# Patient Record
Sex: Female | Born: 1995 | Hispanic: No | Marital: Married | Smoking: Never smoker
Health system: Southern US, Community
[De-identification: ages and names within clinical notes are randomized; demographics above are authoritative.]

## PROBLEM LIST (undated history)

## (undated) DIAGNOSIS — F32A Depression, unspecified: Secondary | ICD-10-CM

## (undated) DIAGNOSIS — E282 Polycystic ovarian syndrome: Secondary | ICD-10-CM

## (undated) DIAGNOSIS — J45909 Unspecified asthma, uncomplicated: Secondary | ICD-10-CM

## (undated) DIAGNOSIS — D509 Iron deficiency anemia, unspecified: Secondary | ICD-10-CM

## (undated) DIAGNOSIS — F329 Major depressive disorder, single episode, unspecified: Secondary | ICD-10-CM

## (undated) DIAGNOSIS — O99019 Anemia complicating pregnancy, unspecified trimester: Secondary | ICD-10-CM

## (undated) HISTORY — DX: Polycystic ovarian syndrome: E28.2

---
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
## 2013-12-04 ENCOUNTER — Emergency Department: Payer: Self-pay | Admitting: Emergency Medicine

## 2013-12-04 LAB — URINALYSIS, COMPLETE
Bacteria: NONE SEEN
Bilirubin,UR: NEGATIVE
Blood: NEGATIVE
Glucose,UR: NEGATIVE mg/dL (ref 0–75)
Nitrite: NEGATIVE
Ph: 5 (ref 4.5–8.0)
Protein: 100
RBC,UR: 6 /HPF (ref 0–5)
Specific Gravity: 1.029 (ref 1.003–1.030)
Squamous Epithelial: 144
WBC UR: 14 /HPF (ref 0–5)

## 2013-12-04 LAB — CBC
HCT: 42.4 % (ref 35.0–47.0)
HGB: 14.5 g/dL (ref 12.0–16.0)
MCH: 29.9 pg (ref 26.0–34.0)
MCHC: 34.2 g/dL (ref 32.0–36.0)
MCV: 88 fL (ref 80–100)
Platelet: 278 10*3/uL (ref 150–440)
RBC: 4.84 10*6/uL (ref 3.80–5.20)
RDW: 12.5 % (ref 11.5–14.5)
WBC: 14.4 10*3/uL — ABNORMAL HIGH (ref 3.6–11.0)

## 2013-12-04 LAB — HCG, QUANTITATIVE, PREGNANCY: Beta Hcg, Quant.: 60236 m[IU]/mL — ABNORMAL HIGH

## 2013-12-13 DIAGNOSIS — D509 Iron deficiency anemia, unspecified: Secondary | ICD-10-CM

## 2013-12-13 HISTORY — DX: Iron deficiency anemia, unspecified: D50.9

## 2014-04-29 ENCOUNTER — Observation Stay: Payer: Self-pay

## 2014-07-20 ENCOUNTER — Inpatient Hospital Stay: Payer: Self-pay

## 2014-07-20 LAB — CBC WITH DIFFERENTIAL/PLATELET
Basophil #: 0.1 10*3/uL (ref 0.0–0.1)
Basophil %: 0.3 %
Eosinophil #: 0 10*3/uL (ref 0.0–0.7)
Eosinophil %: 0.1 %
HCT: 33.5 % — ABNORMAL LOW (ref 35.0–47.0)
HGB: 10.3 g/dL — ABNORMAL LOW (ref 12.0–16.0)
Lymphocyte #: 1.1 10*3/uL (ref 1.0–3.6)
Lymphocyte %: 5.9 %
MCH: 23.4 pg — ABNORMAL LOW (ref 26.0–34.0)
MCHC: 30.8 g/dL — ABNORMAL LOW (ref 32.0–36.0)
MCV: 76 fL — ABNORMAL LOW (ref 80–100)
Monocyte #: 1.2 x10 3/mm — ABNORMAL HIGH (ref 0.2–0.9)
Monocyte %: 6.4 %
Neutrophil #: 16.5 10*3/uL — ABNORMAL HIGH (ref 1.4–6.5)
Neutrophil %: 87.3 %
Platelet: 309 10*3/uL (ref 150–440)
RBC: 4.4 10*6/uL (ref 3.80–5.20)
RDW: 17.4 % — ABNORMAL HIGH (ref 11.5–14.5)
WBC: 18.9 10*3/uL — ABNORMAL HIGH (ref 3.6–11.0)

## 2014-07-22 LAB — GC/CHLAMYDIA PROBE AMP

## 2014-07-22 LAB — HEMATOCRIT: HCT: 23.6 % — ABNORMAL LOW (ref 35.0–47.0)

## 2015-04-05 NOTE — Op Note (Signed)
PATIENT NAME:  Cindy Aguilar, Prabhleen M MR#:  [id number] DATE OF BIRTH:  1996-07-06  DATE OF PROCEDURE:  07/21/2014.  PREOPERATIVE DIAGNOSES:  1.  Intrauterine pregnancy at 40 weeks, 4 days gestational age.  2.  Secondary arrest of dilatation.   POSTOPERATIVE DIAGNOSES: 1.  Intrauterine pregnancy at 40 weeks, 4 days gestational age.  2.  Secondary arrest of dilatation.   PROCEDURE: Primary low transverse cesarean section via Pfannenstiel incision.   ANESTHESIA: Spinal.   SURGEON: Thomasene MohairStephen Lashara Urey, MD.   ASSISTANT: Deatra RobinsonKaren Jones, CNM.   ESTIMATED BLOOD LOSS: 500 mL.   OPERATIVE FLUIDS: 1000 mL crystalloid,   COMPLICATIONS: None.   FINDINGS:  Viable female infant with Apgars of 9 at 1 minute and 9 at 5 minutes with thick meconium fluid.  SPECIMENS: None.   CONDITION AT THE END OF PROCEDURE: Stable.   PROCEDURE IN DETAIL: The patient was taken to the operating room where spinal anesthesia was administered and found to be adequate. The patient was placed in the dorsal supine position with a leftward tilt and prepped and draped in the usual sterile fashion. After a timeout was called a Pfannenstiel incision was made and carried through the various layers until the peritoneum was identified and entered sharply. A bladder flap was created and the bladder retractor placed to pull the bladder out of the operative area of interest. A low transverse hysterotomy was made with a scalpel and extended laterally with cranial and caudal tension. The fetal vertex was elevated to the hysterotomy and delivered followed by the shoulders and the rest of the body with fundal pressure without difficulty. The cord was clamped and cut and the infant was handed to the pediatricians. The placenta delivered spontaneously, intact with a 3-vessel cord. The uterus was cleared of all clots and debris.   The hysterotomy was closed using 0-Vicryl in a running locked fashion. A 2nd layer of the same suture was used in an  imbricating fashion to obtain hemostasis. The uterus was returned to the abdomen and the gutters were cleared of all clots and debris. The rectus muscle bellies were reapproximated using a horizontal mattress suture.   The On-Q catheters were placed according to the manufacturer's recommendations. They were placed approximately 4 cm cephalad to the incision line, approximately 1 cm apart, straddling the midline. They were inserted to a depth of approximately the one-third marking on the catheters. They were positioned just superficial to the rectus abdominis muscles and just deep to the rectus fascia. The fascia was closed using 1-0 PDS looped in a running fashion. The skin edges were reapproximated using 3-0 Vicryl interrupted. Skin was closed using a subcuticular stapler.   The On-Q catheters were affixed to the skin using Dermabond as well as Steri-Strips and Tegaderm. Each catheter was bolused with 5 mL of 5% Marcaine plain for a total of 10 mL.   The patient tolerated the procedure well. Sponge, lap, and needle counts were correct x 2. The patient received 2 grams of Ancef prior to skin incision for antibiotic prophylaxis. For VTE prophylaxis she was wearing pneumatic compression stockings which were on and operating throughout the entire procedure. The patient was taken to the recovery area in stable condition.    ____________________________ Conard NovakStephen D. Solana Coggin, MD sdj:lt D: 07/21/2014 06:36:09 ET T: 07/21/2014 07:33:08 ET JOB#: [id number]  cc: Conard NovakStephen D. Deysha Cartier, MD, <Dictator> Conard NovakSTEPHEN D Marajade Lei MD ELECTRONICALLY SIGNED 08/28/2014 18:08

## 2015-04-22 NOTE — H&P (Signed)
L&D Evaluation:  History Expanded:  HPI 19 yo G1, EDD of 07/17/14 per 8 wk US, presents at 1133w6d with c/o no fetal movement and bright red vaginal bleeding this am. Denies contractions or pain, no LOF. Reports recent yellow/green vaginal discharge. [facility], entry to care at 13 weeks. Negative Tetra. Otherwise unremarkable. Anatomy US showed anterior placenta.   Blood Type (Maternal) B positive   Group B Strep Results Maternal (Result >5wks must be treated as unknown) unknown/result > 5 weeks ago   Maternal HIV Negative   Maternal Syphilis Ab Nonreactive   Maternal Varicella Immune   Rubella Results (Maternal) immune   Presents with decreased fetal movement, vaginal bleeding   Patient's Medical History No Chronic Illness   Patient's Surgical History none   Medications Pre Natal Vitamins   Allergies NKDA   Social History none   Exam:  Vital Signs stable   General no apparent distress   Mental Status clear   Abdomen gravid, non-tender   Pelvic no external lesions, cervix closed and thick, dark red blood clot noted in vaginal vault, wet mount negative   FHT normal rate with no decels   Ucx absent   Impression:  Impression IUP at 6633w6d with vaginal bleeding   Plan:  Comments US ordered to assess placenta location and for abruption   Electronic Signatures: Ishmel Acevedo, Marta Lamasamara K (CNM)  (Signed 18-May-15 13:30)  Authored: L&D Evaluation   Last Updated: 18-May-15 13:30 by Vella KohlerBrothers, Barbarann Kelly K (CNM)

## 2015-04-22 NOTE — H&P (Signed)
L&D Evaluation:  History Expanded:  HPI 19 yo G1 at 8337w3d gestational age by LMP consistent with 1st trimester ultrasound.  Pregnancy has been uncomplicated.  Presents with regular uterine contractions since about 6am today, getting stronger.  Her cervical exam in the office this week was 1cm.  She notes positive fetal movement, no leakage of fluid. She has had some blood mixed with her discharge today.   Gravida 1   Blood Type (Maternal) B positive   Group B Strep Results Maternal (Result >5wks must be treated as unknown) negative  06/19/14   Maternal HIV Negative   Maternal Syphilis Ab Nonreactive   Maternal Varicella Immune   Rubella Results (Maternal) immune   [facility]   Patient's Medical History No Chronic Illness   Patient's Surgical History none   Medications Pre Natal Vitamins   Allergies NKDA   Social History none   Family History Non-Contributory   ROS:  ROS All systems were reviewed.  HEENT, CNS, GI, GU, Respiratory, CV, Renal and Musculoskeletal systems were found to be normal., unless noted in HPI   Exam:  Vital Signs T98.4, BP 131/72, P81, R16   Urine Protein not completed   General no apparent distress   Mental Status clear   Chest clear   Heart normal sinus rhythm   Abdomen gravid, non-tender   Estimated Fetal Weight 6.75 pounds   Fetal Position ceph   Back no CVAT   Edema no edema   Pelvic no external lesions, 4cm per RN (Change from 3cm at presentation)   Mebranes Intact   FHT normal rate with no decels, 135/mod var/+accels/no decels   Ucx regular, 2-3 q 10 min   Skin no lesions   Impression:  Impression 1) Intrauterine pregnancy at 6037w3d gestational age, 2) active labor   Plan:  Comments 1) Labor: expectant management  2) Fetus - category I tracing  3) PNL B postive / ABSC negative / RI / VZI / HIV neg / RPR NR / HBsAg neg / 2nd trimester screen negative / 1-hr OGTT 89 / GBS negative on 06/19/14      4) TDAP  given 05/28/14  5) Disposition - home postpartum   Electronic Signatures: Conard NovakJackson, Rynlee Lisbon D (MD)  (Signed 08-Aug-15 14:59)  Authored: L&D Evaluation   Last Updated: 08-Aug-15 14:59 by Conard NovakJackson, Vicenta Olds D (MD)

## 2015-10-31 ENCOUNTER — Encounter (HOSPITAL_COMMUNITY): Payer: Self-pay | Admitting: Nurse Practitioner

## 2015-10-31 ENCOUNTER — Emergency Department (HOSPITAL_COMMUNITY)
Admission: EM | Admit: 2015-10-31 | Discharge: 2015-10-31 | Disposition: A | Discharge status: Home / Self Care | Payer: BLUE CROSS/BLUE SHIELD | Attending: Emergency Medicine | Admitting: Emergency Medicine

## 2015-10-31 DIAGNOSIS — R809 Proteinuria, unspecified: Secondary | ICD-10-CM | POA: Insufficient documentation

## 2015-10-31 DIAGNOSIS — Z3202 Encounter for pregnancy test, result negative: Secondary | ICD-10-CM | POA: Insufficient documentation

## 2015-10-31 DIAGNOSIS — R55 Syncope and collapse: Secondary | ICD-10-CM | POA: Insufficient documentation

## 2015-10-31 DIAGNOSIS — R231 Pallor: Secondary | ICD-10-CM | POA: Insufficient documentation

## 2015-10-31 HISTORY — DX: Anemia complicating pregnancy, unspecified trimester: O99.019

## 2015-10-31 HISTORY — DX: Iron deficiency anemia, unspecified: D50.9

## 2015-10-31 LAB — URINALYSIS, ROUTINE W REFLEX MICROSCOPIC
Bilirubin Urine: NEGATIVE
Glucose, UA: NEGATIVE mg/dL
Hgb urine dipstick: NEGATIVE
Ketones, ur: 15 mg/dL — AB
Nitrite: NEGATIVE
Protein, ur: 100 mg/dL — AB
Specific Gravity, Urine: 1.037 — ABNORMAL HIGH (ref 1.005–1.030)
pH: 6 (ref 5.0–8.0)

## 2015-10-31 LAB — CBC
HCT: 37.4 % (ref 36.0–46.0)
Hemoglobin: 13 g/dL (ref 12.0–15.0)
MCH: 30.3 pg (ref 26.0–34.0)
MCHC: 34.8 g/dL (ref 30.0–36.0)
MCV: 87.2 fL (ref 78.0–100.0)
Platelets: 257 10*3/uL (ref 150–400)
RBC: 4.29 MIL/uL (ref 3.87–5.11)
RDW: 12.3 % (ref 11.5–15.5)
WBC: 10.9 10*3/uL — ABNORMAL HIGH (ref 4.0–10.5)

## 2015-10-31 LAB — BASIC METABOLIC PANEL
Anion gap: 7 (ref 5–15)
BUN: 11 mg/dL (ref 6–20)
CO2: 26 mmol/L (ref 22–32)
Calcium: 9.3 mg/dL (ref 8.9–10.3)
Chloride: 105 mmol/L (ref 101–111)
Creatinine, Ser: 0.65 mg/dL (ref 0.44–1.00)
GFR calc Af Amer: >60 mL/min (ref >60)
GFR calc non Af Amer: >60 mL/min (ref >60)
Glucose, Bld: 108 mg/dL — ABNORMAL HIGH (ref 65–99)
Potassium: 3.8 mmol/L (ref 3.5–5.1)
Sodium: 138 mmol/L (ref 135–145)

## 2015-10-31 LAB — URINE MICROSCOPIC-ADD ON

## 2015-10-31 LAB — CBG MONITORING, ED: Glucose-Capillary: 62 mg/dL — ABNORMAL LOW (ref 65–99)

## 2015-10-31 LAB — HCG, QUANTITATIVE, PREGNANCY: hCG, Beta Chain, Quant, S: <1 m[IU]/mL (ref <5)

## 2015-10-31 LAB — POC URINE PREG, ED: Preg Test, Ur: NEGATIVE

## 2015-10-31 MED ORDER — SODIUM CHLORIDE 0.9 % IV BOLUS (SEPSIS)
1000.0000 mL | Freq: Once | INTRAVENOUS | Status: AC
  Administered 2015-10-31: 1000 mL via INTRAVENOUS

## 2015-10-31 NOTE — ED Notes (Signed)
Bed: WA06 Expected date:  Expected time:  Means of arrival:  Comments: EMS- 19yo F, syncope

## 2015-10-31 NOTE — Progress Notes (Signed)
Patient listed as having [employer] insurance without a pcp.  EDCM spoke to patient at bedside.  Patient reports her pcp is located in [place] at [facility] Chelsea Holland NP.  System updated.

## 2015-10-31 NOTE — ED Notes (Signed)
Nurse currently trying to pull blood from IV

## 2015-10-31 NOTE — ED Notes (Signed)
[facility]. Was shopping at [employer] [facility] [employer] when she began to feel nauseous and week. She complains of seeing spots and then losing consciousness. Her husband assisted her to the ground and she denies falling or injury. She was unconscious for approximately 15-20 seconds per family members. Pt began exhibiting cold like symptoms less than 24 hours ago and is taking dayquil for symptomatic relief and increasing fluids. She denies ever having lost consciousness before, no history of cardiac illness or vertigo.

## 2015-10-31 NOTE — Discharge Instructions (Signed)
Your workup today was unremarkable. As we discussed, please follow-up with your primary care doctor within one week. Please have them re-check your urine to make sure the protein in your urine is resolving. Drink plenty of fluids. Return to the ER for new or worsening symptoms.  Syncope Syncope is a medical term for fainting or passing out. This means you lose consciousness and drop to the ground. People are generally unconscious for less than 5 minutes. You may have some muscle twitches for up to 15 seconds before waking up and returning to normal. Syncope occurs more often in older adults, but it can happen to anyone. While most causes of syncope are not dangerous, syncope can be a sign of a serious medical problem. It is important to seek medical care.  CAUSES  Syncope is caused by a sudden drop in blood flow to the brain. The specific cause is often not determined. Factors that can bring on syncope include:  Taking medicines that lower blood pressure.  Sudden changes in posture, such as standing up quickly.  Taking more medicine than prescribed.  Standing in one place for too long.  Seizure disorders.  Dehydration and excessive exposure to heat.  Low blood sugar (hypoglycemia).  Straining to have a bowel movement.  Heart disease, irregular heartbeat, or other circulatory problems.  Fear, emotional distress, seeing blood, or severe pain. SYMPTOMS  Right before fainting, you may:  Feel dizzy or light-headed.  Feel nauseous.  See all white or all black in your field of vision.  Have cold, clammy skin. DIAGNOSIS  Your health care provider will ask about your symptoms, perform a physical exam, and perform an electrocardiogram (ECG) to record the electrical activity of your heart. Your health care provider may also perform other heart or blood tests to determine the cause of your syncope which may include:  Transthoracic echocardiogram (TTE). During echocardiography, sound waves  are used to evaluate how blood flows through your heart.  Transesophageal echocardiogram (TEE).  Cardiac monitoring. This allows your health care provider to monitor your heart rate and rhythm in real time.  Holter monitor. This is a portable device that records your heartbeat and can help diagnose heart arrhythmias. It allows your health care provider to track your heart activity for several days, if needed.  Stress tests by exercise or by giving medicine that makes the heart beat faster. TREATMENT  In most cases, no treatment is needed. Depending on the cause of your syncope, your health care provider may recommend changing or stopping some of your medicines. HOME CARE INSTRUCTIONS  Have someone stay with you until you feel stable.  Do not drive, use machinery, or play sports until your health care provider says it is okay.  Keep all follow-up appointments as directed by your health care provider.  Lie down right away if you start feeling like you might faint. Breathe deeply and steadily. Wait until all the symptoms have passed.  Drink enough fluids to keep your urine clear or pale yellow.  If you are taking blood pressure or heart medicine, get up slowly and take several minutes to sit and then stand. This can reduce dizziness. SEEK IMMEDIATE MEDICAL CARE IF:   You have a severe headache.  You have unusual pain in the chest, abdomen, or back.  You are bleeding from your mouth or rectum, or you have black or tarry stool.  You have an irregular or very fast heartbeat.  You have pain with breathing.  You have repeated  fainting or seizure-like jerking during an episode.  You faint when sitting or lying down.  You have confusion.  You have trouble walking.  You have severe weakness.  You have vision problems. If you fainted, call your local emergency services (911 in [place] Do not drive yourself to the hospital.    This information is not intended to replace advice  given to you by your health care provider. Make sure you discuss any questions you have with your health care provider.   Document Released: 11/29/2005 Document Revised: 04/15/2015 Document Reviewed: 01/28/2012 Elsevier Interactive Patient Education [employer] Elsevier Inc.   Please obtain all of your results from medical records or have your doctors office obtain the results - share them with your doctor - you should be seen at your doctors office in the next 2 days. Call today to arrange your follow up. Take the medications as prescribed. Please review all of the medicines and only take them if you do not have an allergy to them. Please be aware that if you are taking birth control pills, taking other prescriptions, ESPECIALLY ANTIBIOTICS may make the birth control ineffective - if this is the case, either do not engage in sexual activity or use alternative methods of birth control such as condoms until you have finished the medicine and your family doctor says it is OK to restart them. If you are on a blood thinner such as COUMADIN, be aware that any other medicine that you take may cause the coumadin to either work too much, or not enough - you should have your coumadin level rechecked in next 7 days if this is the case.  ?  It is also a possibility that you have an allergic reaction to any of the medicines that you have been prescribed - Everybody reacts differently to medications and while MOST people have no trouble with most medicines, you may have a reaction such as nausea, vomiting, rash, swelling, shortness of breath. If this is the case, please stop taking the medicine immediately and contact your physician.  ?  You should return to the ER if you develop severe or worsening symptoms.

## 2015-11-03 NOTE — ED Provider Notes (Signed)
CSN: [id number]     Arrival date & time 10/31/15  1638 History   First MD Initiated Contact with Patient 10/31/15 1732     Chief Complaint  Patient presents with  . Loss of Consciousness    HPI   Cindy Aguilar is an 19 y.o. female with no significant PMH who presents to the ED for evaluation following a syncopal episode. States she was standing in line at the grocery store when suddenly she felt clammy and weak. Her husband reports seeing her fall back and catching her before she hit the ground. States that pt was unconscious for ~30sec and then regained consciousness. She does not remember experiencing any prodromal symptoms. Pt reports she now feels a little tired but otherwise back to baseline. She states she has never fainted before. Denies history of seizures. She does report she has had a cold with nasal congestion for the past day or so and has not been eating much. She denies any chest pain, palpitations, SOB, fever, chills, N/V/D. Denies fam hx of sudden cardiac arrest at a young age. Denies feeling dizzy or weak. Denies headache, visual changes, tinnitus. She did not bite her tongue.  Past Medical History  Diagnosis Date  . Iron deficiency anemia of pregnancy 2015   Past Surgical History  Procedure Laterality Date  . Cesarean section  2015   History reviewed. No pertinent family history. Social History  Substance Use Topics  . Smoking status: Never Smoker   . Smokeless tobacco: None  . Alcohol Use: No   OB History    No data available     Review of Systems  All other systems reviewed and are negative.     Allergies  Review of patient's allergies indicates no known allergies.  Home Medications   Prior to Admission medications   Medication Sig Start Date End Date Taking? Authorizing Provider  levonorgestrel (MIRENA) 20 MCG/24HR IUD 1 each by Intrauterine route once.   Yes Historical Provider, MD  Pseudoephedrine-APAP-DM (DAYQUIL PO) Take 1 tablet by mouth once.   Yes  Historical Provider, MD   BP 109/67 mmHg  Pulse 93  Temp(Src) 98.1 F (36.7 C) (Oral)  Resp 18  Ht  (1.626 m)  Wt 52.164 kg  BMI 19.73 kg/m2  SpO2 100%  LMP 10/13/2013 (Approximate) Physical Exam  Constitutional: She is oriented to person, place, and time.  Pt appears tired, slightly pale, but otherwise NAD  HENT:  Right Ear: External ear normal.  Left Ear: External ear normal.  Nose: Nose normal.  Mouth/Throat: Oropharynx is clear and moist. No oropharyngeal exudate.  Eyes: Conjunctivae and EOM are normal. Pupils are equal, round, and reactive to light.  Neck: Normal range of motion. Neck supple.  Cardiovascular: Normal rate, regular rhythm, normal heart sounds and intact distal pulses.   No murmur heard. Pulmonary/Chest: Effort normal and breath sounds normal. No respiratory distress. She has no wheezes. She exhibits no tenderness.  Abdominal: Soft. Bowel sounds are normal. She exhibits no distension. There is no tenderness. There is no rebound and no guarding.  Musculoskeletal: Normal range of motion. She exhibits no edema or tenderness.  Neurological: She is alert and oriented to person, place, and time. She has normal strength. No cranial nerve deficit or sensory deficit. She displays a negative Romberg sign. Coordination and gait normal.  Skin: Skin is warm and dry. There is pallor.  Psychiatric: She has a normal mood and affect.  Nursing note and vitals reviewed.   ED Course  Procedures (including critical care time) Labs Review Labs Reviewed  BASIC METABOLIC PANEL - Abnormal; Notable for the following:    Glucose, Bld 108 (*)    All other components within normal limits  CBC - Abnormal; Notable for the following:    WBC 10.9 (*)    All other components within normal limits  URINALYSIS, ROUTINE W REFLEX MICROSCOPIC (NOT AT [facility]) - Abnormal; Notable for the following:    APPearance CLOUDY (*)    Specific Gravity, Urine 1.037 (*)    Ketones, ur 15 (*)     Protein, ur 100 (*)    Leukocytes, UA TRACE (*)    All other components within normal limits  URINE MICROSCOPIC-ADD ON - Abnormal; Notable for the following:    Squamous Epithelial / LPF 6-30 (*)    Bacteria, UA FEW (*)    Casts HYALINE CASTS (*)    All other components within normal limits  CBG MONITORING, ED - Abnormal; Notable for the following:    Glucose-Capillary 62 (*)    All other components within normal limits  HCG, QUANTITATIVE, PREGNANCY  CBG MONITORING, ED  POC URINE PREG, ED    Imaging Review No results found. I have personally reviewed and evaluated these images and lab results as part of my medical decision-making.   EKG Interpretation   Date/Time:  Friday October 31 2015 17:42:06 EST Ventricular Rate:  79 PR Interval:  141 QRS Duration: 89 QT Interval:  396 QTC Calculation: 454 R Axis:   67 Text Interpretation:  Sinus rhythm RSR' in V1 or V2, probably normal  variant No previous ECGs available Confirmed by YAO  MD, DAVID ([id number]) on  10/31/2015 5:49:35 PM         MDM   Final diagnoses:  Syncope, unspecified syncope type  Proteinuria   Initial workup unrevealing. EKG NSR. Pt does have some protein in her urine, likely 2/2 dehydration from poor PO intake recently. Will give 1L bolus. Pt's urine shows some trace leuks and bacteria but pt is asymptomatic so will send for culture and defer abx tx at this time. Orthostatic vitals negative. Likely neurocardiogenic syncope vs orthostasis 2/2 hypovolemia and poor PO intake. STrict return precautions given. Pt stable for discharge at this time. Pt instructed to f/u with PCP regarding UA. She verbalizes her agreement.    Carlene CoriaSerena Y Amilcar Reever, PA-C 11/03/15 1125  Richardean Canalavid H Yao, MD 11/04/15 [phone]

## 2017-12-16 ENCOUNTER — Ambulatory Visit: Payer: Self-pay | Admitting: Obstetrics and Gynecology

## 2018-01-20 ENCOUNTER — Other Ambulatory Visit: Payer: Self-pay | Admitting: Nurse Practitioner

## 2018-01-20 DIAGNOSIS — N939 Abnormal uterine and vaginal bleeding, unspecified: Secondary | ICD-10-CM

## 2018-01-24 ENCOUNTER — Ambulatory Visit: Payer: BLUE CROSS/BLUE SHIELD

## 2018-02-03 ENCOUNTER — Ambulatory Visit
Admission: RE | Admit: 2018-02-03 | Discharge: 2018-02-03 | Disposition: A | Discharge status: Home / Self Care | Payer: BLUE CROSS/BLUE SHIELD | Source: Ambulatory Visit | Attending: Nurse Practitioner | Admitting: Nurse Practitioner

## 2018-02-03 DIAGNOSIS — N939 Abnormal uterine and vaginal bleeding, unspecified: Secondary | ICD-10-CM

## 2018-02-03 DIAGNOSIS — N838 Other noninflammatory disorders of ovary, fallopian tube and broad ligament: Secondary | ICD-10-CM | POA: Insufficient documentation

## 2018-02-03 DIAGNOSIS — Z975 Presence of (intrauterine) contraceptive device: Secondary | ICD-10-CM | POA: Insufficient documentation

## 2018-03-03 ENCOUNTER — Ambulatory Visit (INDEPENDENT_AMBULATORY_CARE_PROVIDER_SITE_OTHER): Payer: BLUE CROSS/BLUE SHIELD | Admitting: Advanced Practice Midwife

## 2018-03-03 ENCOUNTER — Encounter: Payer: Self-pay | Admitting: Advanced Practice Midwife

## 2018-03-03 VITALS — BP 110/80 | HR 78 | Ht 63.0 in | Wt 122.0 lb

## 2018-03-03 DIAGNOSIS — Z30432 Encounter for removal of intrauterine contraceptive device: Secondary | ICD-10-CM | POA: Diagnosis not present

## 2018-03-03 NOTE — Progress Notes (Addendum)
    GYNECOLOGY OFFICE PROCEDURE NOTE  [name] is a 22 y.o. G1P1001 here for IUD removal. The patient currently has a Mirena IUD placed 3 years ago. She is requesting removal of the IUD. She and her husband are planning to conceive.  IUD Removal  Patient identified, informed consent performed, consent signed. Time out was performed. Speculum placed in the vagina. The strings of the IUD were grasped and pulled using ring forceps. The IUD was successfully removed in its entirety. Patient tolerated procedure well.   Patient had recent episode of uterine bleeding. [facility] with normal uterus and right ovary finding. Her left ovary was mildly enlarged at 14 cubic cm. Her IUD was in the correct location. She plans to follow up with [facility] regarding the findings of her left ovary.   Tresea MallJane Kaitrin Seybold, CNM [facility], [facility]  IUD insertion CPT [phone],  [facility] [id number] Mirena [id number] Penni BombardLiletta [phone] Paraguard J7300 Rutha BouchardKyleena [id number] IUD remval [id number] Modifer 25, plus Modifer 79 is done during a global billing visit

## 2018-08-04 ENCOUNTER — Ambulatory Visit (INDEPENDENT_AMBULATORY_CARE_PROVIDER_SITE_OTHER): Payer: BLUE CROSS/BLUE SHIELD | Admitting: Advanced Practice Midwife

## 2018-08-04 ENCOUNTER — Other Ambulatory Visit (HOSPITAL_COMMUNITY)
Admission: RE | Admit: 2018-08-04 | Discharge: 2018-08-04 | Disposition: A | Discharge status: Home / Self Care | Payer: BLUE CROSS/BLUE SHIELD | Source: Ambulatory Visit | Attending: Advanced Practice Midwife | Admitting: Advanced Practice Midwife

## 2018-08-04 ENCOUNTER — Encounter: Payer: Self-pay | Admitting: Advanced Practice Midwife

## 2018-08-04 VITALS — BP 100/60 | Wt 126.0 lb

## 2018-08-04 DIAGNOSIS — Z98891 History of uterine scar from previous surgery: Secondary | ICD-10-CM

## 2018-08-04 DIAGNOSIS — O0990 Supervision of high risk pregnancy, unspecified, unspecified trimester: Secondary | ICD-10-CM | POA: Insufficient documentation

## 2018-08-04 DIAGNOSIS — N912 Amenorrhea, unspecified: Secondary | ICD-10-CM

## 2018-08-04 DIAGNOSIS — Z3201 Encounter for pregnancy test, result positive: Secondary | ICD-10-CM

## 2018-08-04 DIAGNOSIS — Z113 Encounter for screening for infections with a predominantly sexual mode of transmission: Secondary | ICD-10-CM

## 2018-08-04 DIAGNOSIS — O34219 Maternal care for unspecified type scar from previous cesarean delivery: Secondary | ICD-10-CM

## 2018-08-04 DIAGNOSIS — Z3A14 14 weeks gestation of pregnancy: Secondary | ICD-10-CM

## 2018-08-04 LAB — POCT URINALYSIS DIPSTICK OB: Glucose, UA: NEGATIVE — AB

## 2018-08-04 LAB — POCT URINE PREGNANCY: Preg Test, Ur: POSITIVE — AB

## 2018-08-04 NOTE — Progress Notes (Signed)
New Obstetric Patient H&P    Chief Complaint: "Desires prenatal care"   History of Present Illness: Patient is a 22 y.o. G2P1001 Not Hispanic or Latino female, presents with amenorrhea and positive home pregnancy test. Patient's last menstrual period was 04/28/2018 (approximate). and based on her  LMP, her EDD is Estimated Date of Delivery: 02/02/19 and her EGA is [id number]. She has not had a regular period since having her IUD removed in March. She has had occasional irregular days of spotting. Her last pap smear was 1 years ago and was no abnormalities.    She had a urine pregnancy test which was positive 1 month(s)  ago. Since her LMP she claims she has experienced breast tenderness, fatigue, nausea. She denies vaginal bleeding. Her past medical history is noncontributory. Her prior pregnancies are notable for ear presentation and failure to progress/primary c/section 2015  Since her LMP, she admits to the use of tobacco products  no She claims she has gained   2 pounds since the start of her pregnancy.  There are cats in the home in the home  no  She admits close contact with children on a regular basis  yes  She has had chicken pox in the past yes She has had Tuberculosis exposures, symptoms, or previously tested positive for TB   no Current or past history of domestic violence. no  Genetic Screening/Teratology Counseling: (Includes patient, baby's father, or anyone in either family with:)   1. [facility] 24 [facility]  no 2. Thalassemia ([place], [place], Mediterranean, or Asian background): MCV<80  no 3. Neural tube defect (meningomyelocele, spina bifida, anencephaly)  no 4. Congenital heart defect  no  5. Down syndrome  no 6. Tay-Sachs (Jewish, [place])  no 7. Canavan's Disease  no 8. Sickle cell disease or trait (African)  no  9. Hemophilia or other blood disorders  no  10. Muscular dystrophy  no  11. Cystic fibrosis  no  12. Huntington's Chorea  no  13. Mental  retardation/autism  no 14. Other inherited genetic or chromosomal disorder  no 15. Maternal metabolic disorder (DM, PKU, etc)  no 16. Patient or FOB with a child with a birth defect not listed above no  16a. Patient or FOB with a birth defect themselves no 17. Recurrent pregnancy loss, or stillbirth  no  18. Any medications since LMP other than prenatal vitamins (include vitamins, supplements, OTC meds, drugs, alcohol)  no 19. Any other genetic/environmental exposure to discuss  no  Infection History:   1. Lives with someone with TB or TB exposed  no  2. Patient or partner has history of genital herpes  no 3. Rash or viral illness since LMP  no 4. History of STI (GC, CT, HPV, syphilis, HIV)  no 5. History of recent travel :  no  Other pertinent information:  no     Review of Systems:10 point review of systems negative unless otherwise noted in HPI  Past Medical History:  Past Medical History:  Diagnosis Date  . Cystic dysplasia of one kidney    BENIGN  . Iron deficiency anemia of pregnancy 2015  . PCOS (polycystic ovarian syndrome)    LEFT OVARY ENLARGED    Past Surgical History:  Past Surgical History:  Procedure Laterality Date  . CESAREAN SECTION  2015    Gynecologic History: Patient's last menstrual period was 04/28/2018 (approximate).  Obstetric History: G2P1001  Family History:  Family History  Problem Relation Age of Onset  .  Cancer Father 37       LEUKEMIA  . Diabetes Maternal Uncle   . Heart Problems Maternal Uncle        OPEN HEART SURGERY  . Diabetes Maternal Uncle     Social History:  Social History   Socioeconomic History  . Marital status: Married    Spouse name: Not on file  . Number of children: 1  . Years of education: 44  . Highest education level: Not on file  Occupational History  . Occupation: RECEPTIONIST    Comment: [employer]  Social Needs  . Financial resource strain: Not on file  . Food insecurity:    Worry: Not on file      Inability: Not on file  . Transportation needs:    Medical: Not on file    Non-medical: Not on file  Tobacco Use  . Smoking status: Never Smoker  . Smokeless tobacco: Never Used  Substance and Sexual Activity  . Alcohol use: No  . Drug use: No  . Sexual activity: Yes    Birth control/protection: None  Lifestyle  . Physical activity:    Days per week: Not on file    Minutes per session: Not on file  . Stress: Not on file  Relationships  . Social connections:    Talks on phone: Not on file    Gets together: Not on file    Attends religious service: Not on file    Active member of club or organization: Not on file    Attends meetings of clubs or organizations: Not on file    Relationship status: Not on file  . Intimate partner violence:    Fear of current or ex partner: Not on file    Emotionally abused: Not on file    Physically abused: Not on file    Forced sexual activity: Not on file  Other Topics Concern  . Not on file  Social History Narrative  . Not on file    Allergies:  No Known Allergies  Medications: Prior to Admission medications   Not on File    Physical Exam Vitals: Blood pressure 100/60, weight 126 lb (57.2 kg), last menstrual period 04/28/2018.  General: NAD HEENT: normocephalic, anicteric Thyroid: no enlargement, no palpable nodules Pulmonary: No increased work of breathing, CTAB Cardiovascular: RRR, distal pulses 2+ Abdomen: NABS, soft, non-tender, non-distended.  Umbilicus without lesions.  No hepatomegaly, splenomegaly or masses palpable. No evidence of hernia  Genitourinary:  External: Normal external female genitalia.  Normal urethral meatus, normal  Bartholin's and Skene's glands.    Vagina: Normal vaginal mucosa, no evidence of prolapse.    Cervix: Grossly normal in appearance, no bleeding, no CMT  Uterus: Enlarged, mobile, normal contour.    Adnexa: ovaries non-enlarged, no adnexal masses  Rectal: deferred Extremities: no edema,  erythema, or tenderness Neurologic: Grossly intact Psychiatric: mood appropriate, affect full   Assessment: 22 y.o. G2P1001 at [redacted]w[redacted]d presenting to initiate prenatal care  Plan: 1) Avoid alcoholic beverages. 2) Patient encouraged not to smoke.  3) Discontinue the use of all non-medicinal drugs and chemicals.  4) Take prenatal vitamins daily.  5) Nutrition, food safety (fish, cheese advisories, and high nitrite foods) and exercise discussed. 6) Hospital and practice style discussed with cross coverage system.  7) Genetic Screening, such as with 1st Trimester Screening, cell free fetal DNA, AFP testing, and Ultrasound, as well as with amniocentesis and CVS as appropriate, is discussed with patient. At the conclusion of today's visit patient  requested first trimester genetic testing 8) Patient is asked about travel to areas at risk for the Zika virus, and counseled to avoid travel and exposure to mosquitoes or sexual partners who may have themselves been exposed to the virus. Testing is discussed, and will be ordered as appropriate.  9) Dating scan in 1 week 10) NOB labs today   Tresea Mall, CNM [facility], [facility] 08/04/2018, 2:51 PM

## 2018-08-04 NOTE — Patient Instructions (Signed)
Exercise During Pregnancy For people of all ages, exercise is an important part of being healthy. Exercise improves heart and lung function and helps to maintain strength, flexibility, and a healthy body weight. Exercise also boosts energy levels and elevates mood. For most women, maintaining an exercise routine throughout pregnancy is recommended. It is only on rare occasions and with certain medical conditions or pregnancy complications that women may be asked to limit or avoid exercise during pregnancy. What are some other benefits to exercising during pregnancy? Along with maintaining strength and flexibility, exercising throughout pregnancy can help to:  Keep strength in muscles that are very important during labor and childbirth.  Decrease low back pain during pregnancy.  Decrease the risk of developing gestational diabetes mellitus (GDM).  Improve blood sugar (glucose) control for women who have GDM.  Decrease the risk of developing preeclampsia. This is a serious condition that causes high blood pressure along with other symptoms, such as swelling and headaches.  Decrease the risk of cesarean delivery.  Speed up the recovery after giving birth.  How often should I exercise? Unless your health care provider gives you different instructions, you should try to exercise on most days or all days of the week. In general, try to exercise with moderate intensity for about 150 minutes per week. This can be spread out across several days, such as exercising for 30 minutes per day on 5 days of each week. You can tell that you are exercising at a moderate intensity if you have a higher heart rate and faster breathing, but you are still able to hold a conversation. What types of moderate-intensity exercise are recommended during pregnancy? There are many types of exercise that are safe for you to do during pregnancy. Unless your health care provider gives you different instructions, do a variety of  exercises that safely increase your heart and breathing (cardiopulmonary) rates and help you to build and maintain muscle strength (strength training). You should always be able to talk in full sentences while exercising during pregnancy. Some examples of exercising that is safe to do during pregnancy include:  Brisk walking or hiking.  Swimming.  Water aerobics.  Riding a stationary bike.  Strength training.  Modified yoga or Pilates. Tell your instructor that you are pregnant. Avoid overstretching and avoid lying on your back for long periods of time.  Running or jogging. Only choose this type of exercise if: ? You ran or jogged regularly before your pregnancy. ? You can run or jog and still talk in complete sentences.  What types of exercise should I not do during pregnancy? Depending on your level of fitness and whether you exercised regularly before your pregnancy, you may be advised to limit vigorous-intensity exercise during your pregnancy. You can tell that you are exercising at a vigorous intensity if you are breathing much harder and faster and cannot hold a conversation while exercising. Some examples of exercising that you should avoid during pregnancy include:  Contact sports.  Activities that place you at risk for falling on or being hit in the belly, such as downhill skiing, water skiing, surfing, rock climbing, cycling, gymnastics, and horseback riding.  Scuba diving.  Sky diving.  Yoga or Pilates in a room that is heated to extreme temperatures ("hot yoga" or "hot Pilates").  Jogging or running, unless you ran or jogged regularly before your pregnancy. While jogging or running, you should always be able to talk in full sentences. Do not run or jog so vigorously   that you are unable to have a conversation.  If you are not used to exercising at elevation (more than 6,000 feet above sea level), do not do so during your pregnancy.  When should I avoid exercising  during pregnancy? Certain medical conditions can make it unsafe to exercise during pregnancy, or they may increase your risk of miscarriage or early labor and birth. Some of these conditions include:  Some types of heart disease.  Some types of lung disease.  Placenta previa. This is when the placenta partially or completely covers the opening of the uterus (cervix).  Frequent bleeding from the vagina during your pregnancy.  Incompetent cervix. This is when your cervix does not remain as tightly closed during pregnancy as it should.  Premature labor.  Ruptured membranes. This is when the protective sac (amniotic sac) opens up and amniotic fluid leaks from your vagina.  Severely low blood count (anemia).  Preeclampsia or pregnancy-caused high blood pressure.  Carrying more than one baby (multiple gestation) and having an additional risk of early labor.  Poorly controlled diabetes.  Being severely underweight or severely overweight.  Intrauterine growth restriction. This is when your baby's growth and development during pregnancy are slower than expected.  Other medical conditions. Ask your health care provider if any apply to you.  What else should I know about exercising during pregnancy? You should take these precautions while exercising during pregnancy:  Avoid overheating. ? Wear loose-fitting, breathable clothes. ? Do not exercise in very high temperatures.  Avoid dehydration. Drink enough water before, during, and after exercise to keep your urine clear or pale yellow.  Avoid overstretching. Because of hormone changes during pregnancy, it is easy to overstretch muscles, tendons, and ligaments during pregnancy.  Start slowly and ask your health care provider to recommend types of exercise that are safe for you, if exercising regularly is new for you.  Pregnancy is not a time for exercising to lose weight. When should I seek medical care? You should stop exercising  and call your health care provider if you have any unusual symptoms, such as:  Mild uterine contractions or abdominal cramping.  Dizziness that does not improve with rest.  When should I seek immediate medical care? You should stop exercising and call your local emergency services (911 in the [place] if you have any unusual symptoms, such as:  Sudden, severe pain in your low back or your belly.  Uterine contractions or abdominal cramping that do not improve with rest.  Chest pain.  Bleeding or fluid leaking from your vagina.  Shortness of breath.  This information is not intended to replace advice given to you by your health care provider. Make sure you discuss any questions you have with your health care provider. Document Released: 11/29/2005 Document Revised: 04/28/2016 Document Reviewed: 02/06/2015 Elsevier Interactive Patient Education  2018 [employer] Eating Plan for Pregnant Women While you are pregnant, your body will require additional nutrition to help support your growing baby. It is recommended that you consume:  150 additional calories each day during your first trimester.  300 additional calories each day during your second trimester.  300 additional calories each day during your third trimester.  Eating a healthy, well-balanced diet is very important for your health and for your baby's health. You also have a higher need for some vitamins and minerals, such as folic acid, calcium, iron, and vitamin D. What do I need to know about eating during pregnancy?  Do not try to lose weight   or go on a diet during pregnancy.  Choose healthy, nutritious foods. Choose  of a sandwich with a glass of milk instead of a candy bar or a high-calorie sugar-sweetened beverage.  Limit your overall intake of foods that have "empty calories." These are foods that have little nutritional value, such as sweets, desserts, candies, sugar-sweetened beverages, and fried foods.  Eat a  variety of foods, especially fruits and vegetables.  Take a prenatal vitamin to help meet the additional needs during pregnancy, specifically for folic acid, iron, calcium, and vitamin D.  Remember to stay active. Ask your health care provider for exercise recommendations that are specific to you.  Practice good food safety and cleanliness, such as washing your hands before you eat and after you prepare raw meat. This helps to prevent foodborne illnesses, such as listeriosis, that can be very dangerous for your baby. Ask your health care provider for more information about listeriosis. What does 150 extra calories look like? Healthy options for an additional 150 calories each day could be any of the following:  Plain low-fat yogurt (6-8 oz) with  cup of berries.  1 apple with 2 teaspoons of peanut butter.  Cut-up vegetables with  cup of hummus.  Low-fat chocolate milk (8 oz or 1 cup).  1 string cheese with 1 medium orange.   of a peanut butter and jelly sandwich on whole-wheat bread (1 tsp of peanut butter).  For 300 calories, you could eat two of those healthy options each day. What is a healthy amount of weight to gain? The recommended amount of weight for you to gain is based on your pre-pregnancy BMI. If your pre-pregnancy BMI was:  Less than 18 (underweight), you should gain 28-40 lb.  18-24.9 (normal), you should gain 25-35 lb.  25-29.9 (overweight), you should gain 15-25 lb.  Greater than 30 (obese), you should gain 11-20 lb.  What if I am having twins or multiples? Generally, pregnant women who will be having twins or multiples may need to increase their daily calories by 300-600 calories each day. The recommended range for total weight gain is 25-54 lb, depending on your pre-pregnancy BMI. Talk with your health care provider for specific guidance about additional nutritional needs, weight gain, and exercise during your pregnancy. What foods can I eat? Grains Any  grains. Try to choose whole grains, such as whole-wheat bread, oatmeal, or brown rice. Vegetables Any vegetables. Try to eat a variety of colors and types of vegetables to get a full range of vitamins and minerals. Remember to wash your vegetables well before eating. Fruits Any fruits. Try to eat a variety of colors and types of fruit to get a full range of vitamins and minerals. Remember to wash your fruits well before eating. Meats and Other Protein Sources Lean meats, including chicken, turkey, fish, and lean cuts of beef, veal, or pork. Make sure that all meats are cooked to "well done." Tofu. Tempeh. Beans. Eggs. Peanut butter and other nut butters. Seafood, such as shrimp, crab, and lobster. If you choose fish, select types that are higher in omega-3 fatty acids, including salmon, herring, mussels, trout, sardines, and pollock. Make sure that all meats are cooked to food-safe temperatures. Dairy Pasteurized milk and milk alternatives. Pasteurized yogurt and pasteurized cheese. Cottage cheese. Sour cream. Beverages Water. Juices that contain 100% fruit juice or vegetable juice. Caffeine-free teas and decaffeinated coffee. Drinks that contain caffeine are okay to drink, but it is better to avoid caffeine. Keep your total caffeine   intake to less than 200 mg each day (12 oz of coffee, tea, or soda) or as directed by your health care provider. Condiments Any pasteurized condiments. Sweets and Desserts Any sweets and desserts. Fats and Oils Any fats and oils. The items listed above may not be a complete list of recommended foods or beverages. Contact your dietitian for more options. What foods are not recommended? Vegetables Unpasteurized (raw) vegetable juices. Fruits Unpasteurized (raw) fruit juices. Meats and Other Protein Sources Cured meats that have nitrates, such as bacon, salami, and hotdogs. Luncheon meats, bologna, or other deli meats (unless they are reheated until they are  steaming hot). Refrigerated pate, meat spreads from a meat counter, smoked seafood that is found in the refrigerated section of a store. Raw fish, such as sushi or sashimi. High mercury content fish, such as tilefish, shark, swordfish, and king mackerel. Raw meats, such as tuna or beef tartare. Undercooked meats and poultry. Make sure that all meats are cooked to food-safe temperatures. Dairy Unpasteurized (raw) milk and any foods that have raw milk in them. Soft cheeses, such as feta, queso blanco, queso fresco, Brie, Camembert cheeses, blue-veined cheeses, and Panela cheese (unless it is made with pasteurized milk, which must be stated on the label). Beverages Alcohol. Sugar-sweetened beverages, such as sodas, teas, or energy drinks. Condiments Homemade fermented foods and drinks, such as pickles, sauerkraut, or kombucha drinks. (Store-bought pasteurized versions of these are okay.) Other Salads that are made in the store, such as ham salad, chicken salad, egg salad, tuna salad, and seafood salad. The items listed above may not be a complete list of foods and beverages to avoid. Contact your dietitian for more information. This information is not intended to replace advice given to you by your health care provider. Make sure you discuss any questions you have with your health care provider. Document Released: 09/13/2014 Document Revised: 05/06/2016 Document Reviewed: 05/14/2014 Elsevier Interactive Patient Education  2018 [employer] Prenatal Care WHAT IS PRENATAL CARE? Prenatal care is the process of caring for a pregnant woman before she gives birth. Prenatal care makes sure that she and her baby remain as healthy as possible throughout pregnancy. Prenatal care may be provided by a midwife, family practice health care provider, or a childbirth and pregnancy specialist (obstetrician). Prenatal care may include physical examinations, testing, treatments, and education on nutrition, lifestyle, and  social support services. WHY IS PRENATAL CARE SO IMPORTANT? Early and consistent prenatal care increases the chance that you and your baby will remain healthy throughout your pregnancy. This type of care also decreases a baby's risk of being born too early (prematurely), or being born smaller than expected (small for gestational age). Any underlying medical conditions you may have that could pose a risk during your pregnancy are discussed during prenatal care visits. You will also be monitored regularly for any new conditions that may arise during your pregnancy so they can be treated quickly and effectively. WHAT HAPPENS DURING PRENATAL CARE VISITS? Prenatal care visits may include the following: Discussion Tell your health care provider about any new signs or symptoms you have experienced since your last visit. These might include:  Nausea or vomiting.  Increased or decreased level of energy.  Difficulty sleeping.  Back or leg pain.  Weight changes.  Frequent urination.  Shortness of breath with physical activity.  Changes in your skin, such as the development of a rash or itchiness.  Vaginal discharge or bleeding.  Feelings of excitement or nervousness.  Changes in   your baby's movements.  You may want to write down any questions or topics you want to discuss with your health care provider and bring them with you to your appointment. Examination During your first prenatal care visit, you will likely have a complete physical exam. Your health care provider will often examine your vagina, cervix, and the position of your uterus, as well as check your heart, lungs, and other body systems. As your pregnancy progresses, your health care provider will measure the size of your uterus and your baby's position inside your uterus. He or she may also examine you for early signs of labor. Your prenatal visits may also include checking your blood pressure and, after about 10-12 weeks of  pregnancy, listening to your baby's heartbeat. Testing Regular testing often includes:  Urinalysis. This checks your urine for glucose, protein, or signs of infection.  Blood count. This checks the levels of white and red blood cells in your body.  Tests for sexually transmitted infections (STIs). Testing for STIs at the beginning of pregnancy is routinely done and is required in many states.  Antibody testing. You will be checked to see if you are immune to certain illnesses, such as rubella, that can affect a developing fetus.  Glucose screen. Around 24-28 weeks of pregnancy, your blood glucose level will be checked for signs of gestational diabetes. Follow-up tests may be recommended.  Group B strep. This is a bacteria that is commonly found inside a woman's vagina. This test will inform your health care provider if you need an antibiotic to reduce the amount of this bacteria in your body prior to labor and childbirth.  Ultrasound. Many pregnant women undergo an ultrasound screening around 18-20 weeks of pregnancy to evaluate the health of the fetus and check for any developmental abnormalities.  HIV (human immunodeficiency virus) testing. Early in your pregnancy, you will be screened for HIV. If you are at high risk for HIV, this test may be repeated during your third trimester of pregnancy.  You may be offered other testing based on your age, personal or family medical history, or other factors. HOW OFTEN SHOULD I PLAN TO SEE MY HEALTH CARE PROVIDER FOR PRENATAL CARE? Your prenatal care check-up schedule depends on any medical conditions you have before, or develop during, your pregnancy. If you do not have any underlying medical conditions, you will likely be seen for checkups:  Monthly, during the first 6 months of pregnancy.  Twice a month during months 7 and 8 of pregnancy.  Weekly starting in the 9th month of pregnancy and until delivery.  If you develop signs of early labor  or other concerning signs or symptoms, you may need to see your health care provider more often. Ask your health care provider what prenatal care schedule is best for you. WHAT CAN I DO TO KEEP MYSELF AND MY BABY AS HEALTHY AS POSSIBLE DURING MY PREGNANCY?  Take a prenatal vitamin containing 400 micrograms (0.4 mg) of folic acid every day. Your health care provider may also ask you to take additional vitamins such as iodine, vitamin D, iron, copper, and zinc.  Take 1500-2000 mg of calcium daily starting at your 20th week of pregnancy until you deliver your baby.  Make sure you are up to date on your vaccinations. Unless directed otherwise by your health care provider: ? You should receive a tetanus, diphtheria, and pertussis (Tdap) vaccination between the 27th and 36th week of your pregnancy, regardless of when your last Tdap immunization   occurred. This helps protect your baby from whooping cough (pertussis) after he or she is born. ? You should receive an annual inactivated influenza vaccine (IIV) to help protect you and your baby from influenza. This can be done at any point during your pregnancy.  Eat a well-rounded diet that includes: ? Fresh fruits and vegetables. ? Lean proteins. ? Calcium-rich foods such as milk, yogurt, hard cheeses, and dark, leafy greens. ? Whole grain breads.  Do noteat seafood high in mercury, including: ? Swordfish. ? Tilefish. ? Shark. ? King mackerel. ? More than 6 oz tuna per week.  Do not eat: ? Raw or undercooked meats or eggs. ? Unpasteurized foods, such as soft cheeses (brie, blue, or feta), juices, and milks. ? Lunch meats. ? Hot dogs that have not been heated until they are steaming.  Drink enough water to keep your urine clear or pale yellow. For many women, this may be 10 or more 8 oz glasses of water each day. Keeping yourself hydrated helps deliver nutrients to your baby and may prevent the start of pre-term uterine contractions.  Do not use  any tobacco products including cigarettes, chewing tobacco, or electronic cigarettes. If you need help quitting, ask your health care provider.  Do not drink beverages containing alcohol. No safe level of alcohol consumption during pregnancy has been determined.  Do not use any illegal drugs. These can harm your developing baby or cause a miscarriage.  Ask your health care provider or pharmacist before taking any prescription or over-the-counter medicines, herbs, or supplements.  Limit your caffeine intake to no more than 200 mg per day.  Exercise. Unless told otherwise by your health care provider, try to get 30 minutes of moderate exercise most days of the week. Do not  do high-impact activities, contact sports, or activities with a high risk of falling, such as horseback riding or downhill skiing.  Get plenty of rest.  Avoid anything that raises your body temperature, such as hot tubs and saunas.  If you own a cat, do not empty its litter box. Bacteria contained in cat feces can cause an infection called toxoplasmosis. This can result in serious harm to the fetus.  Stay away from chemicals such as insecticides, lead, mercury, and cleaning or paint products that contain solvents.  Do not have any X-rays taken unless medically necessary.  Take a childbirth and breastfeeding preparation class. Ask your health care provider if you need a referral or recommendation.  This information is not intended to replace advice given to you by your health care provider. Make sure you discuss any questions you have with your health care provider. Document Released: 12/02/2003 Document Revised: 05/03/2016 Document Reviewed: 02/13/2014 Elsevier Interactive Patient Education  2017 [employer] Inc[employer]  

## 2018-08-04 NOTE — Progress Notes (Signed)
Initial prenatal visit No concerns

## 2018-08-05 LAB — RPR+RH+ABO+RUB AB+AB SCR+CB...
Antibody Screen: NEGATIVE
HIV Screen 4th Generation wRfx: NONREACTIVE
Hematocrit: 39.6 % (ref 34.0–46.6)
Hemoglobin: 13.3 g/dL (ref 11.1–15.9)
Hepatitis B Surface Ag: NEGATIVE
MCH: 29.6 pg (ref 26.6–33.0)
MCHC: 33.6 g/dL (ref 31.5–35.7)
MCV: 88 fL (ref 79–97)
Platelets: 317 10*3/uL (ref 150–450)
RBC: 4.49 x10E6/uL (ref 3.77–5.28)
RDW: 12.6 % (ref 12.3–15.4)
RPR Ser Ql: NONREACTIVE
Rh Factor: POSITIVE
Rubella Antibodies, IGG: 1.52 index (ref >0.99)
Varicella zoster IgG: 1194 index (ref >165)
WBC: 11.2 10*3/uL — ABNORMAL HIGH (ref 3.4–10.8)

## 2018-08-06 LAB — URINE CULTURE

## 2018-08-07 LAB — CERVICOVAGINAL ANCILLARY ONLY
Chlamydia: NEGATIVE
Neisseria Gonorrhea: NEGATIVE
Trichomonas: NEGATIVE

## 2018-08-18 ENCOUNTER — Ambulatory Visit (INDEPENDENT_AMBULATORY_CARE_PROVIDER_SITE_OTHER): Payer: BLUE CROSS/BLUE SHIELD | Admitting: Maternal Newborn

## 2018-08-18 ENCOUNTER — Other Ambulatory Visit: Payer: Self-pay | Admitting: Advanced Practice Midwife

## 2018-08-18 ENCOUNTER — Ambulatory Visit (INDEPENDENT_AMBULATORY_CARE_PROVIDER_SITE_OTHER): Payer: BLUE CROSS/BLUE SHIELD

## 2018-08-18 ENCOUNTER — Encounter: Payer: Self-pay | Admitting: Maternal Newborn

## 2018-08-18 VITALS — BP 110/70 | Wt 126.0 lb

## 2018-08-18 DIAGNOSIS — O0990 Supervision of high risk pregnancy, unspecified, unspecified trimester: Secondary | ICD-10-CM

## 2018-08-18 DIAGNOSIS — O3680X0 Pregnancy with inconclusive fetal viability, not applicable or unspecified: Secondary | ICD-10-CM

## 2018-08-18 DIAGNOSIS — Z3A09 9 weeks gestation of pregnancy: Secondary | ICD-10-CM

## 2018-08-18 DIAGNOSIS — O0991 Supervision of high risk pregnancy, unspecified, first trimester: Secondary | ICD-10-CM

## 2018-08-18 LAB — POCT URINALYSIS DIPSTICK OB
Glucose, UA: NEGATIVE
POC,PROTEIN,UA: NEGATIVE

## 2018-08-18 NOTE — Progress Notes (Signed)
    Routine Prenatal Care Visit  Subjective  Cindy Aguilar is a 22 y.o. G2P1001 at [redacted]w[redacted]d being seen today for ongoing prenatal care.  She is currently monitored for the following issues for this high-risk pregnancy and has Supervision of high risk pregnancy, antepartum on their problem list.  ----------------------------------------------------------------------------------- Patient reports nausea.   Vag. Bleeding: None.  ----------------------------------------------------------------------------------- The following portions of the patient's history were reviewed and updated as appropriate: allergies, current medications, past family history, past medical history, past social history, past surgical history and problem list. Problem list updated.  Objective  Blood pressure 110/70, weight 126 lb (57.2 kg), last menstrual period 04/28/2018. Pregravid weight 120 lb (54.4 kg) Total Weight Gain 6 lb (2.722 kg) Body mass index is 22.32 kg/m. Urinalysis: Protein Negative, Glucose Negative  Fetal Status: Fetal Heart Rate (bpm): 169         General:  Alert, oriented and cooperative. Patient is in no acute distress.  Skin: Skin is warm and dry. No rash noted.   Cardiovascular: Normal heart rate noted  Respiratory: Normal respiratory effort, no problems with respiration noted  Abdomen: Soft, gravid, appropriate for gestational age. Pain/Pressure: Absent     Pelvic:  Cervical exam deferred        Extremities: Normal range of motion.     Mental Status: Normal mood and affect. Normal behavior. Normal judgment and thought content.     Assessment   22 y.o. G2P1001 at [redacted]w[redacted]d, EDD 02/02/2019 by Ultrasound presenting for a routine prenatal visit.  Plan   SECOND Problems (from 08/04/18 to present)    Problem Noted Resolved   Supervision of high risk pregnancy, antepartum 08/04/2018 by Tresea Mall, CNM No   Overview Addendum 08/18/2018  2:36 PM by Oswaldo Conroy, CNM    [facility] Prenatal  Labs  Dating  Blood type: B/Positive/-- (08/23 1449)   Genetic Screen 1 Screen:    AFP:     Quad:     NIPS: Antibody:Negative (08/23 1449)  Anatomic [place]  Rubella: 1.52 (08/23 1449) Varicella: Immune  GTT Early:               Third trimester:  RPR: Non Reactive (08/23 1449)   Rhogam  HBsAg: Negative (08/23 1449)   TDaP vaccine                       Flu Shot: HIV: Non Reactive (08/23 1449)   Baby Food                                GBS:   Contraception  Pap: 10/14/2017, NILM/HPV Negative  CBB     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  Bonjesta samples given for nausea. She will call for Rx if effective  Return in about 4 weeks (around 09/15/2018) for ROB.  Marcelyn Bruins, CNM 08/18/2018  3:24 PM

## 2018-08-18 NOTE — Progress Notes (Signed)
ROB- no concerns Dating scan today.

## 2018-08-21 NOTE — Telephone Encounter (Signed)
That is fine, I can write her a note when I am in the office Wednesday or if she needs it sooner, maybe someone can get her one tomorrow.

## 2018-08-21 NOTE — Telephone Encounter (Signed)
Please advise 

## 2018-08-23 ENCOUNTER — Encounter: Payer: Self-pay | Admitting: Maternal Newborn

## 2018-09-14 ENCOUNTER — Encounter: Payer: BLUE CROSS/BLUE SHIELD | Admitting: Obstetrics & Gynecology

## 2018-09-15 ENCOUNTER — Ambulatory Visit (INDEPENDENT_AMBULATORY_CARE_PROVIDER_SITE_OTHER): Payer: BLUE CROSS/BLUE SHIELD | Admitting: Advanced Practice Midwife

## 2018-09-15 ENCOUNTER — Encounter: Payer: Self-pay | Admitting: Advanced Practice Midwife

## 2018-09-15 VITALS — BP 114/74 | Wt 138.0 lb

## 2018-09-15 DIAGNOSIS — Z98891 History of uterine scar from previous surgery: Secondary | ICD-10-CM

## 2018-09-15 DIAGNOSIS — O0990 Supervision of high risk pregnancy, unspecified, unspecified trimester: Secondary | ICD-10-CM

## 2018-09-15 DIAGNOSIS — Z23 Encounter for immunization: Secondary | ICD-10-CM

## 2018-09-15 DIAGNOSIS — Z3A13 13 weeks gestation of pregnancy: Secondary | ICD-10-CM

## 2018-09-15 DIAGNOSIS — O34219 Maternal care for unspecified type scar from previous cesarean delivery: Secondary | ICD-10-CM

## 2018-09-15 HISTORY — DX: History of uterine scar from previous surgery: Z98.891

## 2018-09-15 LAB — POCT URINALYSIS DIPSTICK OB
Glucose, UA: NEGATIVE
POC,PROTEIN,UA: NEGATIVE

## 2018-09-15 NOTE — Patient Instructions (Signed)

## 2018-09-15 NOTE — Progress Notes (Signed)
  Routine Prenatal Care Visit  Subjective  Cindy Aguilar is a 22 y.o. G2P1001 at [redacted]w[redacted]d being seen today for ongoing prenatal care.  She is currently monitored for the following issues for this high-risk pregnancy and has Supervision of high risk pregnancy, antepartum and History of primary cesarean section on their problem list.  ----------------------------------------------------------------------------------- Patient reports no complaints.  She requests flu shot today.  Contractions: Not present. Vag. Bleeding: None.   . Denies leaking of fluid.  ----------------------------------------------------------------------------------- The following portions of the patient's history were reviewed and updated as appropriate: allergies, current medications, past family history, past medical history, past social history, past surgical history and problem list. Problem list updated.   Objective  Blood pressure 114/74, weight 138 lb (62.6 kg), last menstrual period 04/28/2018. Pregravid weight 120 lb (54.4 kg) Total Weight Gain 18 lb (8.165 kg) Urinalysis: Urine Protein Negative  Urine Glucose Negative  Fetal Status: Fetal Heart Rate (bpm): 160         General:  Alert, oriented and cooperative. Patient is in no acute distress.  Skin: Skin is warm and dry. No rash noted.   Cardiovascular: Normal heart rate noted  Respiratory: Normal respiratory effort, no problems with respiration noted  Abdomen: Soft, gravid, appropriate for gestational age. Pain/Pressure: Absent     Pelvic:  Cervical exam deferred        Extremities: Normal range of motion.  Edema: None  Mental Status: Normal mood and affect. Normal behavior. Normal judgment and thought content.   Assessment   22 y.o. G2P1001 at [redacted]w[redacted]d by  03/23/2019, by Ultrasound presenting for routine prenatal visit  Plan   SECOND Problems (from 08/04/18 to present)    Problem Noted Resolved   Supervision of high risk pregnancy, antepartum 08/04/2018 by  Tresea Mall, CNM No   Overview Addendum 08/18/2018  2:36 PM by Oswaldo Conroy, CNM    Clinic [facility] Prenatal Labs  Dating  Blood type: B/Positive/-- (08/23 1449)   Genetic Screen 1 Screen:    AFP:     Quad:     NIPS: Antibody:Negative (08/23 1449)  Anatomic [place]  Rubella: 1.52 (08/23 1449) Varicella: Immune  GTT Early:               Third trimester:  RPR: Non Reactive (08/23 1449)   Rhogam  HBsAg: Negative (08/23 1449)   TDaP vaccine                       Flu Shot: HIV: Non Reactive (08/23 1449)   Baby Food                                GBS:   Contraception  Pap: 10/14/2017, NILM/HPV Negative  CBB     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 Please refer to After Visit Summary for other counseling recommendations.   Return in about 4 weeks (around 10/13/2018) for rob.  Tresea Mall, CNM 09/15/2018 2:57 PM

## 2018-09-15 NOTE — Progress Notes (Signed)
No vb. No lof.  

## 2018-09-21 LAB — MATERNIT 21 PLUS CORE, BLOOD
Chromosome 13: NEGATIVE
Chromosome 18: NEGATIVE
Chromosome 21: NEGATIVE
Y Chromosome: NOT DETECTED

## 2018-10-13 ENCOUNTER — Encounter: Payer: Self-pay | Admitting: Maternal Newborn

## 2018-10-13 ENCOUNTER — Ambulatory Visit (INDEPENDENT_AMBULATORY_CARE_PROVIDER_SITE_OTHER): Payer: BLUE CROSS/BLUE SHIELD | Admitting: Maternal Newborn

## 2018-10-13 VITALS — BP 90/60 | Wt 125.5 lb

## 2018-10-13 DIAGNOSIS — Z3689 Encounter for other specified antenatal screening: Secondary | ICD-10-CM

## 2018-10-13 DIAGNOSIS — O0990 Supervision of high risk pregnancy, unspecified, unspecified trimester: Secondary | ICD-10-CM

## 2018-10-13 DIAGNOSIS — Z3A17 17 weeks gestation of pregnancy: Secondary | ICD-10-CM

## 2018-10-13 DIAGNOSIS — O34219 Maternal care for unspecified type scar from previous cesarean delivery: Secondary | ICD-10-CM

## 2018-10-13 LAB — POCT URINALYSIS DIPSTICK OB
Glucose, UA: NEGATIVE
POC,PROTEIN,UA: NEGATIVE

## 2018-10-13 NOTE — Progress Notes (Signed)
No concerns.rj 

## 2018-10-13 NOTE — Progress Notes (Signed)
    Routine Prenatal Care Visit  Subjective  BENTLEIGH WAREN is a 22 y.o. G2P1001 at [redacted]w[redacted]d being seen today for ongoing prenatal care.  She is currently monitored for the following issues for this high-risk pregnancy and has Supervision of high risk pregnancy, antepartum and History of primary cesarean section on their problem list.  ----------------------------------------------------------------------------------- Patient reports that her nausea is improving and she only has it occasionally now.    Vag. Bleeding: None.  Movement: Present. No leaking of fluid.  ----------------------------------------------------------------------------------- The following portions of the patient's history were reviewed and updated as appropriate: allergies, current medications, past family history, past medical history, past social history, past surgical history and problem list. Problem list updated.   Objective  Blood pressure 90/60, weight 125 lb 8 oz (56.9 kg), last menstrual period 04/28/2018. Pregravid weight 120 lb (54.4 kg) Total Weight Gain 5 lb 8 oz (2.495 kg) Body mass index is 22.23 kg/m.   Urinalysis: Protein Negative, Glucose Negative  Fetal Status: Fetal Heart Rate (bpm): 146   Movement: Present     General:  Alert, oriented and cooperative. Patient is in no acute distress.  Skin: Skin is warm and dry. No rash noted.   Cardiovascular: Normal heart rate noted  Respiratory: Normal respiratory effort, no problems with respiration noted  Abdomen: Soft, gravid, appropriate for gestational age. Pain/Pressure: Absent     Pelvic:  Cervical exam deferred        Extremities: Normal range of motion.  Edema: None  Mental Status: Normal mood and affect. Normal behavior. Normal judgment and thought content.    Assessment   22 y.o. G2P1001 at [redacted]w[redacted]d, EDD 03/23/2019 by Ultrasound presenting for a routine prenatal visit.  Plan   SECOND Problems (from 08/04/18 to present)    Problem Noted Resolved    Supervision of high risk pregnancy, antepartum 08/04/2018 by Tresea Mall, CNM No   Overview Addendum 08/18/2018  2:36 PM by Oswaldo Conroy, CNM    [facility]inic [facility] Prenatal Labs  Dating  Blood type: B/Positive/-- (08/23 1449)   Genetic Screen 1 Screen:    AFP:     Quad:     NIPS: Antibody:Negative (08/23 1449)  Anatomic [place]  Rubella: 1.52 (08/23 1449) Varicella: Immune  GTT Early:               Third trimester:  RPR: Non Reactive (08/23 1449)   Rhogam  HBsAg: Negative (08/23 1449)   TDaP vaccine                       Flu Shot: HIV: Non Reactive (08/23 1449)   Baby Food                                GBS:   Contraception  Pap: 10/14/2017, NILM/HPV Negative  CBB     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   Return in about 3 weeks (around 11/03/2018) for ROB and anatomy scan.  Marcelyn Bruins, CNM 10/13/2018  2:13 PM

## 2018-10-13 NOTE — Patient Instructions (Signed)

## 2018-11-03 ENCOUNTER — Ambulatory Visit (INDEPENDENT_AMBULATORY_CARE_PROVIDER_SITE_OTHER): Payer: BLUE CROSS/BLUE SHIELD

## 2018-11-03 ENCOUNTER — Ambulatory Visit (INDEPENDENT_AMBULATORY_CARE_PROVIDER_SITE_OTHER): Payer: BLUE CROSS/BLUE SHIELD | Admitting: Advanced Practice Midwife

## 2018-11-03 ENCOUNTER — Encounter: Payer: Self-pay | Admitting: Advanced Practice Midwife

## 2018-11-03 VITALS — BP 104/70 | Wt 130.0 lb

## 2018-11-03 DIAGNOSIS — O34219 Maternal care for unspecified type scar from previous cesarean delivery: Secondary | ICD-10-CM

## 2018-11-03 DIAGNOSIS — Z3689 Encounter for other specified antenatal screening: Secondary | ICD-10-CM

## 2018-11-03 DIAGNOSIS — Z3A20 20 weeks gestation of pregnancy: Secondary | ICD-10-CM

## 2018-11-03 DIAGNOSIS — Z363 Encounter for antenatal screening for malformations: Secondary | ICD-10-CM

## 2018-11-03 DIAGNOSIS — O0990 Supervision of high risk pregnancy, unspecified, unspecified trimester: Secondary | ICD-10-CM

## 2018-11-03 LAB — POCT URINALYSIS DIPSTICK OB
Glucose, UA: NEGATIVE
POC,PROTEIN,UA: NEGATIVE

## 2018-11-03 NOTE — Progress Notes (Signed)
ROB and anatomy scan- no concerns 

## 2018-11-03 NOTE — Progress Notes (Signed)
  Routine Prenatal Care Visit  Subjective  Cindy Aguilar is a 22 y.o. G2P1001 at 6550w0d being seen today for ongoing prenatal care.  She is currently monitored for the following issues for this high-risk pregnancy and has Supervision of high risk pregnancy, antepartum and History of primary cesarean section on their problem list.  ----------------------------------------------------------------------------------- Patient reports no complaints.   Contractions: Not present. Vag. Bleeding: None.  Movement: Present. Denies leaking of fluid.  ----------------------------------------------------------------------------------- The following portions of the patient's history were reviewed and updated as appropriate: allergies, current medications, past family history, past medical history, past social history, past surgical history and problem list. Problem list updated.   Objective  Blood pressure 104/70, weight 130 lb (59 kg), last menstrual period 04/28/2018. Pregravid weight 120 lb (54.4 kg) Total Weight Gain 10 lb (4.536 kg) Urinalysis: Urine Protein Negative  Urine Glucose Negative  Fetal Status: Fetal Heart Rate (bpm): 148 Fundal Height: 20 cm Movement: Present  Presentation: Vertex  Anatomy scan today is complete/normal/girl  General:  Alert, oriented and cooperative. Patient is in no acute distress.  Skin: Skin is warm and dry. No rash noted.   Cardiovascular: Normal heart rate noted  Respiratory: Normal respiratory effort, no problems with respiration noted  Abdomen: Soft, gravid, appropriate for gestational age. Pain/Pressure: Absent     Pelvic:  Cervical exam deferred        Extremities: Normal range of motion.  Edema: None  Mental Status: Normal mood and affect. Normal behavior. Normal judgment and thought content.   Assessment   22 y.o. G2P1001 at 7850w0d by  03/23/2019, by Ultrasound presenting for routine prenatal visit  Plan   SECOND Problems (from 08/04/18 to present)    Problem Noted Resolved   Supervision of high risk pregnancy, antepartum 08/04/2018 by Tresea MallGledhill, Rainer Mounce, CNM No   Overview Addendum 08/18/2018  2:36 PM by Oswaldo ConroySchmid, Jacelyn Y, CNM    Clinic [facility] Prenatal Labs  Dating  Blood type: B/Positive/-- (08/23 1449)   Genetic Screen 1 Screen:    AFP:     Quad:     NIPS: Antibody:Negative (08/23 1449)  Anatomic US  Rubella: 1.52 (08/23 1449) Varicella: Immune  GTT Early:               Third trimester:  RPR: Non Reactive (08/23 1449)   Rhogam  HBsAg: Negative (08/23 1449)   TDaP vaccine                       Flu Shot: HIV: Non Reactive (08/23 1449)   Baby Food                                GBS:   Contraception  Pap: 10/14/2017, NILM/HPV Negative  CBB     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    Return in about 4 weeks (around 12/01/2018) for rob.  Tresea MallJane Gladiola Madore, CNM 11/03/2018 2:25 PM

## 2018-11-27 ENCOUNTER — Encounter: Payer: Self-pay | Admitting: Emergency Medicine

## 2018-11-27 ENCOUNTER — Emergency Department
Admission: EM | Admit: 2018-11-27 | Discharge: 2018-11-27 | Disposition: A | Discharge status: Home / Self Care | Payer: BLUE CROSS/BLUE SHIELD | Attending: Emergency Medicine | Admitting: Emergency Medicine

## 2018-11-27 ENCOUNTER — Other Ambulatory Visit: Payer: Self-pay

## 2018-11-27 ENCOUNTER — Emergency Department: Payer: BLUE CROSS/BLUE SHIELD

## 2018-11-27 DIAGNOSIS — J45901 Unspecified asthma with (acute) exacerbation: Secondary | ICD-10-CM

## 2018-11-27 DIAGNOSIS — J069 Acute upper respiratory infection, unspecified: Secondary | ICD-10-CM

## 2018-11-27 DIAGNOSIS — Z3A23 23 weeks gestation of pregnancy: Secondary | ICD-10-CM | POA: Insufficient documentation

## 2018-11-27 DIAGNOSIS — M79662 Pain in left lower leg: Secondary | ICD-10-CM | POA: Insufficient documentation

## 2018-11-27 DIAGNOSIS — O99512 Diseases of the respiratory system complicating pregnancy, second trimester: Secondary | ICD-10-CM | POA: Insufficient documentation

## 2018-11-27 MED ORDER — ALBUTEROL SULFATE (2.5 MG/3ML) 0.083% IN NEBU
5.0000 mg | INHALATION_SOLUTION | Freq: Once | RESPIRATORY_TRACT | Status: AC
  Administered 2018-11-27: 5 mg via RESPIRATORY_TRACT
  Filled 2018-11-27: qty 6

## 2018-11-27 MED ORDER — BREATHERITE COLL SPACER ADULT MISC: 0 refills | Status: DC

## 2018-11-27 MED ORDER — ALBUTEROL SULFATE HFA 108 (90 BASE) MCG/ACT IN AERS: 2.0000 | INHALATION_SPRAY | Freq: Four times a day (QID) | RESPIRATORY_TRACT | 2 refills | Status: DC | PRN

## 2018-11-27 NOTE — ED Notes (Signed)
Fetal heart tones:170

## 2018-11-27 NOTE — Discharge Instructions (Signed)
If you have shortness of breath, chest pain or you feel worse in any way including high fevers or difficulty breathing return to the emergency department.  Follow close with your primary care doctor and your OB/GYN.

## 2018-11-27 NOTE — ED Provider Notes (Addendum)
[facility] Department Provider Note  ____________________________________________   I have reviewed the triage vital signs and the nursing notes. Where available I have reviewed prior notes and, if possible and indicated, outside hospital notes.    HISTORY  Chief Complaint Shortness of Breath    HPI Cindy Aguilar is a 22 y.o. female history of mild asthma when she was 23 weeks.  She states starting on Friday she had cold symptoms runny nose, mild sore throat, slight cough.  She states that she has had some wheezing.  She took her home nebs and it did not seem to completely clear so she went to her primary care doctor.  But her primary care doctor, she did reveal that she had had a little twinge of discomfort in her calf at one point in the last couple days, she is no longer feeling that, felt like a muscle strain to her, it happened that she was going up the stairs, there is been no leg swelling, she has no personal or family history of PE or DVT, she has no pleuritic chest pain she has no pain of any variety, she has had no fever, she has felt a little bit warm.  She states that because of this constellation of symptoms she was sent here to rule out PE.  She states that she feels that she just has a head cold and a muscle strain.  Her goal for this visit is to have a nebulizer and make sure that her mild wheezing gets better.  She does not have dyspnea, just cough wheeze runny nose and sore throat and she is not having any vaginal bleeding abdominal pain or gush of fluid.  Still feels baby move    Past Medical History:  Diagnosis Date  . Cystic dysplasia of one kidney    BENIGN  . Iron deficiency anemia of pregnancy 2015  . PCOS (polycystic ovarian syndrome)    LEFT OVARY ENLARGED    Patient Active Problem List   Diagnosis Date Noted  . History of primary cesarean section 09/15/2018  . Supervision of high risk pregnancy, antepartum 08/04/2018     Past Surgical History:  Procedure Laterality Date  . CESAREAN SECTION  2015    Prior to Admission medications   Not on File    Allergies Patient has no known allergies.  Family History  Problem Relation Age of Onset  . Cancer Father 73       LEUKEMIA  . Diabetes Maternal Uncle   . Heart Problems Maternal Uncle        OPEN HEART SURGERY  . Diabetes Maternal Uncle     Social History Social History   Tobacco Use  . Smoking status: Never Smoker  . Smokeless tobacco: Never Used  Substance Use Topics  . Alcohol use: No  . Drug use: No    Review of Systems Constitutional: Grade subjective fevers at home Eyes: No visual changes. ENT: + sore throat. No stiff neck no neck pain, positive rhinorrhea Cardiovascular: Denies chest pain. Respiratory: See HPI Gastrointestinal:   no vomiting.  No diarrhea.  No constipation. Genitourinary: Negative for dysuria. Musculoskeletal: Negative lower extremity swelling Skin: Negative for rash. Neurological: Negative for severe headaches, focal weakness or numbness.   ____________________________________________   PHYSICAL EXAM:  VITAL SIGNS: ED Triage Vitals [11/27/18 1259]  Enc Vitals Group     BP 109/72     Pulse Rate 81     Resp 14  Temp 98.1 F (36.7 C)     Temp Source Oral     SpO2 100 %     Weight 133 lb (60.3 kg)     Height 5\' 2"  (1.575 m)     Head Circumference      Peak Flow      Pain Score 7     Pain Loc      Pain Edu?      Excl. in GC?     Constitutional: Alert and oriented. Well appearing and in no acute distress. Eyes: Conjunctivae are normal Head: Atraumatic HEENT: + congestion/rhinnorhea, patient using a tissue to blow out. Mucous membranes are moist.  Oropharynx non-erythematous but mild cobblestoning appreciated Neck:   Nontender with no meningismus, no masses, no stridor Cardiovascular: Normal rate, regular rhythm. Grossly normal heart sounds.  Good peripheral circulation. Respiratory:  Normal respiratory effort.  No retractions. very slight wheeze no rales or rhonchi.  Good air movement, no respiratory distress Abdominal: Soft and gravid uterus above umbilicus. No distention. No guarding no rebound Back:  There is no focal tenderness or step off.  there is no midline tenderness there are no lesions noted. there is no CVA tenderness Musculoskeletal: No lower extremity tenderness, no upper extremity tenderness. No joint effusions, no DVT signs strong distal pulses no edema Neurologic:  Normal speech and language. No gross focal neurologic deficits are appreciated.  Skin:  Skin is warm, dry and intact. No rash noted. Psychiatric: Mood and affect are normal. Speech and behavior are normal.  ____________________________________________   LABS (all labs ordered are listed, but only abnormal results are displayed)  Labs Reviewed - No data to display  Pertinent labs  results that were available during my care of the patient were reviewed by me and considered in my medical decision making (see chart for details). ____________________________________________  EKG  I personally interpreted any EKGs ordered by me or triage Normal sinus rhythm 88 bpm no acute ST elevation or depression normal axis no acute changes ____________________________________________  RADIOLOGY  Pertinent labs & imaging results that were available during my care of the patient were reviewed by me and considered in my medical decision making (see chart for details). If possible, patient and/or family made aware of any abnormal findings.  US Venous Img Lower Unilateral Left  Result Date: 11/27/2018 CLINICAL DATA:  Left calf pain. Patient is currently [redacted] weeks pregnant. Evaluate for DVT. EXAM: LEFT LOWER EXTREMITY VENOUS DOPPLER ULTRASOUND TECHNIQUE: Gray-scale sonography with graded compression, as well as color Doppler and duplex ultrasound were performed to evaluate the lower extremity deep venous systems  from the level of the common femoral vein and including the common femoral, femoral, profunda femoral, popliteal and calf veins including the posterior tibial, peroneal and gastrocnemius veins when visible. The superficial great saphenous vein was also interrogated. Spectral Doppler was utilized to evaluate flow at rest and with distal augmentation maneuvers in the common femoral, femoral and popliteal veins. COMPARISON:  None. FINDINGS: Contralateral Common Femoral Vein: Respiratory phasicity is normal and symmetric with the symptomatic side. No evidence of thrombus. Normal compressibility. Common Femoral Vein: No evidence of thrombus. Normal compressibility, respiratory phasicity and response to augmentation. Saphenofemoral Junction: No evidence of thrombus. Normal compressibility and flow on color Doppler imaging. Profunda Femoral Vein: No evidence of thrombus. Normal compressibility and flow on color Doppler imaging. Femoral Vein: No evidence of thrombus. Normal compressibility, respiratory phasicity and response to augmentation. Popliteal Vein: No evidence of thrombus. Normal compressibility, respiratory phasicity and  response to augmentation. Calf Veins: No evidence of thrombus. Normal compressibility and flow on color Doppler imaging. Superficial Great Saphenous Vein: No evidence of thrombus. Normal compressibility. Venous Reflux:  None. Other Findings:  None. IMPRESSION: No evidence of DVT within the left lower extremity. Electronically Signed   By: Simonne ComeJohn  Watts M.D.   On: 11/27/2018 13:45   ____________________________________________    PROCEDURES  Procedure(s) performed: None  Procedures  Critical Care performed: None  ____________________________________________   INITIAL IMPRESSION / ASSESSMENT AND PLAN / ED COURSE  Pertinent labs & imaging results that were available during my care of the patient were reviewed by me and considered in my medical decision making (see chart for  details).  Patient here with URI symptoms, history of asthma.  Is having a likely mild asthmatic flare.  We will give her a nebulizer here.  I do not think she has a PE DVT is negative no signs or symptoms of PE, very strongly present signs and symptoms of URI.  Her mom think a chest x-ray is indicated I do not hear any rales or rhonchi and her sats are 100% she has no increased work of breathing, she is had symptoms for couple days include runny nose and cough.  We will give her a breathing treatment and reassess fetal heart tones are 170 no evidence of pregnancy complications  ----------------------------------------- 4:33 PM on 11/27/2018 -----------------------------------------  After treatment, she is clear and anxious to go home. Return precautions given and understood. We will give her albuterol for the house as she is out. I do not think steroids are indicated. She will f/u with her ob.    ____________________________________________   FINAL CLINICAL IMPRESSION(S) / ED DIAGNOSES  Final diagnoses:  None      This chart was dictated using voice recognition software.  Despite best efforts to proofread,  errors can occur which can change meaning.     Jeanmarie PlantMcShane, James A, MD 11/27/18 1559    Jeanmarie PlantMcShane, James A, MD 11/27/18 [phone]

## 2018-11-27 NOTE — ED Triage Notes (Signed)
[redacted] weeks pregnant.  Shortness of breath overt he weekend. Says she also has had pain in left calf.  MD sent here here to r/o dvt/pe

## 2018-12-01 ENCOUNTER — Encounter: Payer: Self-pay | Admitting: Obstetrics and Gynecology

## 2018-12-01 ENCOUNTER — Ambulatory Visit (INDEPENDENT_AMBULATORY_CARE_PROVIDER_SITE_OTHER): Payer: BLUE CROSS/BLUE SHIELD | Admitting: Obstetrics and Gynecology

## 2018-12-01 VITALS — BP 104/60 | Wt 137.0 lb

## 2018-12-01 DIAGNOSIS — O0990 Supervision of high risk pregnancy, unspecified, unspecified trimester: Secondary | ICD-10-CM

## 2018-12-01 DIAGNOSIS — O34219 Maternal care for unspecified type scar from previous cesarean delivery: Secondary | ICD-10-CM

## 2018-12-01 DIAGNOSIS — Z3A24 24 weeks gestation of pregnancy: Secondary | ICD-10-CM

## 2018-12-01 DIAGNOSIS — Z98891 History of uterine scar from previous surgery: Secondary | ICD-10-CM

## 2018-12-01 NOTE — Progress Notes (Signed)
ROB No concerns 

## 2018-12-01 NOTE — Progress Notes (Signed)
Routine Prenatal Care Visit  Subjective  Cindy Aguilar is a 22 y.o. G2P1001 at 620w0d being seen today for ongoing prenatal care.  She is currently monitored for the following issues for this low-risk pregnancy and has Supervision of high risk pregnancy, antepartum and History of primary cesarean section on their problem list.  ----------------------------------------------------------------------------------- Patient reports no complaints. Reports one time event this week in the bath were she noticed a small amount of blood from the nipple.   Contractions: Not present. Vag. Bleeding: None.  Movement: Present. Denies leaking of fluid.  ----------------------------------------------------------------------------------- The following portions of the patient's history were reviewed and updated as appropriate: allergies, current medications, past family history, past medical history, past social history, past surgical history and problem list. Problem list updated.   Objective  Blood pressure 104/60, weight 137 lb (62.1 kg), last menstrual period 04/28/2018. Pregravid weight 120 lb (54.4 kg) Total Weight Gain 17 lb (7.711 kg) Urinalysis:      Fetal Status: Fetal Heart Rate (bpm): 140 Fundal Height: 24 cm Movement: Present     General:  Alert, oriented and cooperative. Patient is in no acute distress.  Skin: Skin is warm and dry. No rash noted.   Cardiovascular: Normal heart rate noted  Respiratory: Normal respiratory effort, no problems with respiration noted  Abdomen: Soft, gravid, appropriate for gestational age. Pain/Pressure: Absent     Pelvic:  Cervical exam deferred        Extremities: Normal range of motion.  Edema: None  Mental Status: Normal mood and affect. Normal behavior. Normal judgment and thought content.     Assessment   [age] y.o. G2P1001 at 3720w0d by  03/23/2019, by Ultrasound presenting for routine prenatal visit  Plan   SECOND Problems (from 08/04/18 to present)      Problem Noted Resolved   Supervision of high risk pregnancy, antepartum 08/04/2018 by Tresea MallGledhill, Jane, CNM No   Overview Addendum 12/01/2018  2:01 PM by Natale MilchSchuman, Christanna R, MD    [facility]inic [facility] Prenatal Labs  Dating  9 wk US Blood type: B/Positive/-- (08/23 1449)   Genetic Screen   NIPS:normal xx Antibody:Negative (08/23 1449)  Anatomic US complete Rubella: 1.52 (08/23 1449) Varicella: Immune  GTT Early:               Third trimester:  RPR: Non Reactive (08/23 1449)   Rhogam Not needed HBsAg: Negative (08/23 1449)   TDaP vaccine                        Flu Shot:09/15/18 HIV: Non Reactive (08/23 1449)   Baby Food                                GBS:   Contraception  Pap: 10/14/2017, NILM/HPV Negative  CBB     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    Discussed observation for small amount of blood seen in nipple discharge.  Uptodate references that bloody nipple discharge is seen in 20% of people in the 2nd and third trimesters and is from hypervascularity of gorwing breast tissue.  Return in about 4 weeks (around 12/29/2018) for ROB and 1GTT.  Natale Milchhristanna R Schuman MD [facility] 12/01/2018, 2:23 PM

## 2018-12-15 ENCOUNTER — Telehealth: Payer: Self-pay

## 2018-12-15 NOTE — Telephone Encounter (Signed)
FMLA/DISABILITY form for [employer] filled out, signature obtained and given to [place] for processing. 

## 2018-12-29 ENCOUNTER — Other Ambulatory Visit: Payer: BLUE CROSS/BLUE SHIELD

## 2018-12-29 ENCOUNTER — Encounter: Payer: BLUE CROSS/BLUE SHIELD | Admitting: Advanced Practice Midwife

## 2019-01-01 ENCOUNTER — Ambulatory Visit (INDEPENDENT_AMBULATORY_CARE_PROVIDER_SITE_OTHER): Payer: Managed Care, Other (non HMO) | Admitting: Advanced Practice Midwife

## 2019-01-01 ENCOUNTER — Encounter: Payer: Self-pay | Admitting: Advanced Practice Midwife

## 2019-01-01 ENCOUNTER — Other Ambulatory Visit: Payer: Managed Care, Other (non HMO)

## 2019-01-01 ENCOUNTER — Other Ambulatory Visit: Payer: Self-pay | Admitting: Advanced Practice Midwife

## 2019-01-01 VITALS — BP 102/70 | Wt 144.0 lb

## 2019-01-01 DIAGNOSIS — Z113 Encounter for screening for infections with a predominantly sexual mode of transmission: Secondary | ICD-10-CM

## 2019-01-01 DIAGNOSIS — O34219 Maternal care for unspecified type scar from previous cesarean delivery: Secondary | ICD-10-CM

## 2019-01-01 DIAGNOSIS — Z130 Encounter for screening for diseases of the blood and blood-forming organs and certain disorders involving the immune mechanism: Secondary | ICD-10-CM

## 2019-01-01 DIAGNOSIS — Z131 Encounter for screening for diabetes mellitus: Secondary | ICD-10-CM

## 2019-01-01 DIAGNOSIS — O0990 Supervision of high risk pregnancy, unspecified, unspecified trimester: Secondary | ICD-10-CM

## 2019-01-01 DIAGNOSIS — Z3A28 28 weeks gestation of pregnancy: Secondary | ICD-10-CM

## 2019-01-01 LAB — POCT URINALYSIS DIPSTICK OB
Glucose, UA: NEGATIVE
POC,PROTEIN,UA: NEGATIVE

## 2019-01-01 NOTE — Progress Notes (Signed)
ROB 28 week labs 

## 2019-01-01 NOTE — Patient Instructions (Signed)
Third Trimester of Pregnancy The third trimester is from week 28 through week 40 (months 7 through 9). The third trimester is a time when the unborn baby (fetus) is growing rapidly. At the end of the ninth month, the fetus is about 20 inches in length and weighs 6-10 pounds. Body changes during your third trimester Your body will continue to go through many changes during pregnancy. The changes vary from woman to woman. During the third trimester:  Your weight will continue to increase. You can expect to gain 25-35 pounds (11-16 kg) by the end of the pregnancy.  You may begin to get stretch marks on your hips, abdomen, and breasts.  You may urinate more often because the fetus is moving lower into your pelvis and pressing on your bladder.  You may develop or continue to have heartburn. This is caused by increased hormones that slow down muscles in the digestive tract.  You may develop or continue to have constipation because increased hormones slow digestion and cause the muscles that push waste through your intestines to relax.  You may develop hemorrhoids. These are swollen veins (varicose veins) in the rectum that can itch or be painful.  You may develop swollen, bulging veins (varicose veins) in your legs.  You may have increased body aches in the pelvis, back, or thighs. This is due to weight gain and increased hormones that are relaxing your joints.  You may have changes in your hair. These can include thickening of your hair, rapid growth, and changes in texture. Some women also have hair loss during or after pregnancy, or hair that feels dry or thin. Your hair will most likely return to normal after your baby is born.  Your breasts will continue to grow and they will continue to become tender. A yellow fluid (colostrum) may leak from your breasts. This is the first milk you are producing for your baby.  Your belly button may stick out.  You may notice more swelling in your hands,  face, or ankles.  You may have increased tingling or numbness in your hands, arms, and legs. The skin on your belly may also feel numb.  You may feel short of breath because of your expanding uterus.  You may have more problems sleeping. This can be caused by the size of your belly, increased need to urinate, and an increase in your body's metabolism.  You may notice the fetus "dropping," or moving lower in your abdomen (lightening).  You may have increased vaginal discharge.  You may notice your joints feel loose and you may have pain around your pelvic bone. What to expect at prenatal visits You will have prenatal exams every 2 weeks until week 36. Then you will have weekly prenatal exams. During a routine prenatal visit:  You will be weighed to make sure you and the baby are growing normally.  Your blood pressure will be taken.  Your abdomen will be measured to track your baby's growth.  The fetal heartbeat will be listened to.  Any test results from the previous visit will be discussed.  You may have a cervical check near your due date to see if your cervix has softened or thinned (effaced).  You will be tested for Group B streptococcus. This happens between 35 and 37 weeks. Your health care provider may ask you:  What your birth plan is.  How you are feeling.  If you are feeling the baby move.  If you have had any abnormal   symptoms, such as leaking fluid, bleeding, severe headaches, or abdominal cramping.  If you are using any tobacco products, including cigarettes, chewing tobacco, and electronic cigarettes.  If you have any questions. Other tests or screenings that may be performed during your third trimester include:  Blood tests that check for low iron levels (anemia).  Fetal testing to check the health, activity level, and growth of the fetus. Testing is done if you have certain medical conditions or if there are problems during the pregnancy.  Nonstress test  (NST). This test checks the health of your baby to make sure there are no signs of problems, such as the baby not getting enough oxygen. During this test, a belt is placed around your belly. The baby is made to move, and its heart rate is monitored during movement. What is false labor? False labor is a condition in which you feel small, irregular tightenings of the muscles in the womb (contractions) that usually go away with rest, changing position, or drinking water. These are called Braxton Hicks contractions. Contractions may last for hours, days, or even weeks before true labor sets in. If contractions come at regular intervals, become more frequent, increase in intensity, or become painful, you should see your health care provider. What are the signs of labor?  Abdominal cramps.  Regular contractions that start at 10 minutes apart and become stronger and more frequent with time.  Contractions that start on the top of the uterus and spread down to the lower abdomen and back.  Increased pelvic pressure and dull back pain.  A watery or bloody mucus discharge that comes from the vagina.  Leaking of amniotic fluid. This is also known as your "water breaking." It could be a slow trickle or a gush. Let your health care provider know if it has a color or strange odor. If you have any of these signs, call your health care provider right away, even if it is before your due date. Follow these instructions at home: Medicines  Follow your health care provider's instructions regarding medicine use. Specific medicines may be either safe or unsafe to take during pregnancy.  Take a prenatal vitamin that contains at least 600 micrograms (mcg) of folic acid.  If you develop constipation, try taking a stool softener if your health care provider approves. Eating and drinking   Eat a balanced diet that includes fresh fruits and vegetables, whole grains, good sources of protein such as meat, eggs, or tofu,  and low-fat dairy. Your health care provider will help you determine the amount of weight gain that is right for you.  Avoid raw meat and uncooked cheese. These carry germs that can cause birth defects in the baby.  If you have low calcium intake from food, talk to your health care provider about whether you should take a daily calcium supplement.  Eat four or five small meals rather than three large meals a day.  Limit foods that are high in fat and processed sugars, such as fried and sweet foods.  To prevent constipation: ? Drink enough fluid to keep your urine clear or pale yellow. ? Eat foods that are high in fiber, such as fresh fruits and vegetables, whole grains, and beans. Activity  Exercise only as directed by your health care provider. Most women can continue their usual exercise routine during pregnancy. Try to exercise for 30 minutes at least 5 days a week. Stop exercising if you experience uterine contractions.  Avoid heavy lifting.  Do   not exercise in extreme heat or humidity, or at high altitudes.  Wear low-heel, comfortable shoes.  Practice good posture.  You may continue to have sex unless your health care provider tells you otherwise. Relieving pain and discomfort  Take frequent breaks and rest with your legs elevated if you have leg cramps or low back pain.  Take warm sitz baths to soothe any pain or discomfort caused by hemorrhoids. Use hemorrhoid cream if your health care provider approves.  Wear a good support bra to prevent discomfort from breast tenderness.  If you develop varicose veins: ? Wear support pantyhose or compression stockings as told by your healthcare provider. ? Elevate your feet for 15 minutes, 3-4 times a day. Prenatal care  Write down your questions. Take them to your prenatal visits.  Keep all your prenatal visits as told by your health care provider. This is important. Safety  Wear your seat belt at all times when driving.  Make  a list of emergency phone numbers, including numbers for family, friends, the hospital, and police and fire departments. General instructions  Avoid cat litter boxes and soil used by cats. These carry germs that can cause birth defects in the baby. If you have a cat, ask someone to clean the litter box for you.  Do not travel far distances unless it is absolutely necessary and only with the approval of your health care provider.  Do not use hot tubs, steam rooms, or saunas.  Do not drink alcohol.  Do not use any products that contain nicotine or tobacco, such as cigarettes and e-cigarettes. If you need help quitting, ask your health care provider.  Do not use any medicinal herbs or unprescribed drugs. These chemicals affect the formation and growth of the baby.  Do not douche or use tampons or scented sanitary pads.  Do not cross your legs for long periods of time.  To prepare for the arrival of your baby: ? Take prenatal classes to understand, practice, and ask questions about labor and delivery. ? Make a trial run to the hospital. ? Visit the hospital and tour the maternity area. ? Arrange for maternity or paternity leave through employers. ? Arrange for family and friends to take care of pets while you are in the hospital. ? Purchase a rear-facing car seat and make sure you know how to install it in your car. ? Pack your hospital bag. ? Prepare the baby's nursery. Make sure to remove all pillows and stuffed animals from the baby's crib to prevent suffocation.  Visit your dentist if you have not gone during your pregnancy. Use a soft toothbrush to brush your teeth and be gentle when you floss. Contact a health care provider if:  You are unsure if you are in labor or if your water has broken.  You become dizzy.  You have mild pelvic cramps, pelvic pressure, or nagging pain in your abdominal area.  You have lower back pain.  You have persistent nausea, vomiting, or  diarrhea.  You have an unusual or bad smelling vaginal discharge.  You have pain when you urinate. Get help right away if:  Your water breaks before 37 weeks.  You have regular contractions less than 5 minutes apart before 37 weeks.  You have a fever.  You are leaking fluid from your vagina.  You have spotting or bleeding from your vagina.  You have severe abdominal pain or cramping.  You have rapid weight loss or weight gain.  You have   shortness of breath with chest pain.  You notice sudden or extreme swelling of your face, hands, ankles, feet, or legs.  Your baby makes fewer than 10 movements in 2 hours.  You have severe headaches that do not go away when you take medicine.  You have vision changes. Summary  The third trimester is from week 28 through week 40, months 7 through 9. The third trimester is a time when the unborn baby (fetus) is growing rapidly.  During the third trimester, your discomfort may increase as you and your baby continue to gain weight. You may have abdominal, leg, and back pain, sleeping problems, and an increased need to urinate.  During the third trimester your breasts will keep growing and they will continue to become tender. A yellow fluid (colostrum) may leak from your breasts. This is the first milk you are producing for your baby.  False labor is a condition in which you feel small, irregular tightenings of the muscles in the womb (contractions) that eventually go away. These are called Braxton Hicks contractions. Contractions may last for hours, days, or even weeks before true labor sets in.  Signs of labor can include: abdominal cramps; regular contractions that start at 10 minutes apart and become stronger and more frequent with time; watery or bloody mucus discharge that comes from the vagina; increased pelvic pressure and dull back pain; and leaking of amniotic fluid. This information is not intended to replace advice given to you by your  health care provider. Make sure you discuss any questions you have with your health care provider. Document Released: 11/23/2001 Document Revised: 01/04/2017 Document Reviewed: 01/04/2017 Elsevier Interactive Patient Education  2019 [employer]  

## 2019-01-01 NOTE — Progress Notes (Signed)
  Routine Prenatal Care Visit  Subjective  Cindy Aguilar is a 23 y.o. G2P1001 at [redacted]w[redacted]d being seen today for ongoing prenatal care.  She is currently monitored for the following issues for this high-risk pregnancy and has Supervision of high risk pregnancy, antepartum and History of primary cesarean section on their problem list.  ----------------------------------------------------------------------------------- Patient reports no complaints.   Contractions: Not present. Vag. Bleeding: None.  Movement: Present. Denies leaking of fluid.  ----------------------------------------------------------------------------------- The following portions of the patient's history were reviewed and updated as appropriate: allergies, current medications, past family history, past medical history, past social history, past surgical history and problem list. Problem list updated.   Objective  Blood pressure 102/70, weight 144 lb (65.3 kg), last menstrual period 04/28/2018. Pregravid weight 120 lb (54.4 kg) Total Weight Gain 24 lb (10.9 kg) Urinalysis: Urine Protein Negative  Urine Glucose Negative  Fetal Status: Fetal Heart Rate (bpm): 150 Fundal Height: 28 cm Movement: Present     General:  Alert, oriented and cooperative. Patient is in no acute distress.  Skin: Skin is warm and dry. No rash noted.   Cardiovascular: Normal heart rate noted  Respiratory: Normal respiratory effort, no problems with respiration noted  Abdomen: Soft, gravid, appropriate for gestational age. Pain/Pressure: Absent     Pelvic:  Cervical exam deferred        Extremities: Normal range of motion.     Mental Status: Normal mood and affect. Normal behavior. Normal judgment and thought content.   Assessment   23 y.o. G2P1001 at [redacted]w[redacted]d by  03/23/2019, by Ultrasound presenting for routine prenatal visit  Plan   SECOND Problems (from 08/04/18 to present)    Problem Noted Resolved   Supervision of high risk pregnancy, antepartum  08/04/2018 by Tresea Mall, CNM No   Overview Addendum 12/01/2018  2:01 PM by Natale Milch, MD    Clinic [facility] Prenatal Labs  Dating  9 wk [place] Blood type: B/Positive/-- (08/23 1449)   Genetic Screen   NIPS:normal xx Antibody:Negative (08/23 1449)  Anatomic [place] complete Rubella: 1.52 (08/23 1449) Varicella: Immune  GTT Early:               Third trimester:  RPR: Non Reactive (08/23 1449)   Rhogam Not needed HBsAg: Negative (08/23 1449)   TDaP vaccine                        Flu Shot:09/15/18 HIV: Non Reactive (08/23 1449)   Baby Food                                GBS:   Contraception  Pap: 10/14/2017, NILM/HPV Negative  CBB     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 Please refer to After Visit Summary for other counseling recommendations.   Return in about 2 weeks (around 01/15/2019) for rob.  Tresea Mall, CNM 01/01/2019 11:02 AM

## 2019-01-02 LAB — 28 WEEK RH+PANEL
Basophils Absolute: 0 10*3/uL (ref 0.0–0.2)
Basos: 0 %
EOS (ABSOLUTE): 0.2 10*3/uL (ref 0.0–0.4)
Eos: 2 %
Gestational Diabetes Screen: 89 mg/dL (ref 65–139)
HIV Screen 4th Generation wRfx: NONREACTIVE
Hematocrit: 35 % (ref 34.0–46.6)
Hemoglobin: 12.2 g/dL (ref 11.1–15.9)
Immature Grans (Abs): 0.3 10*3/uL — ABNORMAL HIGH (ref 0.0–0.1)
Immature Granulocytes: 2 %
Lymphocytes Absolute: 1.5 10*3/uL (ref 0.7–3.1)
Lymphs: 13 %
MCH: 30.2 pg (ref 26.6–33.0)
MCHC: 34.9 g/dL (ref 31.5–35.7)
MCV: 87 fL (ref 79–97)
Monocytes Absolute: 1 10*3/uL — ABNORMAL HIGH (ref 0.1–0.9)
Monocytes: 8 %
Neutrophils Absolute: 9 10*3/uL — ABNORMAL HIGH (ref 1.4–7.0)
Neutrophils: 75 %
Platelets: 266 10*3/uL (ref 150–450)
RBC: 4.04 x10E6/uL (ref 3.77–5.28)
RDW: 12 % (ref 11.7–15.4)
RPR Ser Ql: NONREACTIVE
WBC: 12.1 10*3/uL — ABNORMAL HIGH (ref 3.4–10.8)

## 2019-01-12 ENCOUNTER — Encounter: Payer: Self-pay | Admitting: Maternal Newborn

## 2019-01-12 ENCOUNTER — Ambulatory Visit (INDEPENDENT_AMBULATORY_CARE_PROVIDER_SITE_OTHER): Payer: Managed Care, Other (non HMO) | Admitting: Maternal Newborn

## 2019-01-12 VITALS — BP 118/74 | Wt 147.0 lb

## 2019-01-12 DIAGNOSIS — O34219 Maternal care for unspecified type scar from previous cesarean delivery: Secondary | ICD-10-CM

## 2019-01-12 DIAGNOSIS — O0990 Supervision of high risk pregnancy, unspecified, unspecified trimester: Secondary | ICD-10-CM

## 2019-01-12 DIAGNOSIS — Z23 Encounter for immunization: Secondary | ICD-10-CM

## 2019-01-12 DIAGNOSIS — Z3A30 30 weeks gestation of pregnancy: Secondary | ICD-10-CM

## 2019-01-12 LAB — POCT URINALYSIS DIPSTICK OB: Glucose, UA: NEGATIVE

## 2019-01-12 MED ORDER — TETANUS-DIPHTH-ACELL PERTUSSIS 5-2.5-18.5 LF-MCG/0.5 IM SUSP
0.5000 mL | Freq: Once | INTRAMUSCULAR | Status: AC
  Administered 2019-01-12: 0.5 mL via INTRAMUSCULAR

## 2019-01-12 NOTE — Progress Notes (Signed)
    Routine Prenatal Care Visit  Subjective  Cindy Aguilar is a 23 y.o. G2P1001 at [redacted]w[redacted]d being seen today for ongoing prenatal care.  She is currently monitored for the following issues for this high-risk pregnancy and has Supervision of high risk pregnancy, antepartum and History of primary cesarean section on their problem list.  ----------------------------------------------------------------------------------- Patient reports no complaints.   Contractions: Not present. Vag. Bleeding: None.  Movement: Present. No leaking of fluid.  ----------------------------------------------------------------------------------- The following portions of the patient's history were reviewed and updated as appropriate: allergies, current medications, past family history, past medical history, past social history, past surgical history and problem list. Problem list updated.  Objective  Blood pressure 118/74, weight 147 lb (66.7 kg), last menstrual period 04/28/2018. Pregravid weight 120 lb (54.4 kg) Total Weight Gain 27 lb (12.2 kg) Body mass index is 26.89 kg/m.   Urinalysis: Urine dipstick shows negative for glucose, positive for protein (trace). Fetal Status: Fetal Heart Rate (bpm): 149 Fundal Height: 30 cm Movement: Present     General:  Alert, oriented and cooperative. Patient is in no acute distress.  Skin: Skin is warm and dry. No rash noted.   Cardiovascular: Normal heart rate noted  Respiratory: Normal respiratory effort, no problems with respiration noted  Abdomen: Soft, gravid, appropriate for gestational age. Pain/Pressure: Absent     Pelvic:  Cervical exam deferred        Extremities: Normal range of motion.     Mental Status: Normal mood and affect. Normal behavior. Normal judgment and thought content.    Assessment   23 y.o. G2P1001 at [redacted]w[redacted]d, EDD 03/23/2019 by Ultrasound presenting for a routine prenatal visit.  Plan   SECOND Problems (from 08/04/18 to present)    Problem Noted  Resolved   Supervision of high risk pregnancy, antepartum 08/04/2018 by Tresea Mall, CNM No   Overview Addendum 12/01/2018  2:01 PM by Natale Milch, MD    Clinic [facility] Prenatal Labs  Dating  9 wk [place] Blood type: B/Positive/-- (08/23 1449)   Genetic Screen   NIPS:normal xx Antibody:Negative (08/23 1449)  Anatomic [place] complete Rubella: 1.52 (08/23 1449) Varicella: Immune  GTT Early:               Third trimester:  RPR: Non Reactive (08/23 1449)   Rhogam Not needed HBsAg: Negative (08/23 1449)   TDaP vaccine                        Flu Shot:09/15/18 HIV: Non Reactive (08/23 1449)   Baby Food                                GBS:   Contraception  Pap: 10/14/2017, NILM/HPV Negative  CBB     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   Please refer to After Visit Summary for other counseling recommendations.   Return in about 2 weeks (around 01/26/2019) for ROB.  Marcelyn Bruins, CNM 01/12/2019

## 2019-01-12 NOTE — Patient Instructions (Signed)
Third Trimester of Pregnancy The third trimester is from week 28 through week 40 (months 7 through 9). The third trimester is a time when the unborn baby (fetus) is growing rapidly. At the end of the ninth month, the fetus is about 20 inches in length and weighs 6-10 pounds. Body changes during your third trimester Your body will continue to go through many changes during pregnancy. The changes vary from woman to woman. During the third trimester:  Your weight will continue to increase. You can expect to gain 25-35 pounds (11-16 kg) by the end of the pregnancy.  You may begin to get stretch marks on your hips, abdomen, and breasts.  You may urinate more often because the fetus is moving lower into your pelvis and pressing on your bladder.  You may develop or continue to have heartburn. This is caused by increased hormones that slow down muscles in the digestive tract.  You may develop or continue to have constipation because increased hormones slow digestion and cause the muscles that push waste through your intestines to relax.  You may develop hemorrhoids. These are swollen veins (varicose veins) in the rectum that can itch or be painful.  You may develop swollen, bulging veins (varicose veins) in your legs.  You may have increased body aches in the pelvis, back, or thighs. This is due to weight gain and increased hormones that are relaxing your joints.  You may have changes in your hair. These can include thickening of your hair, rapid growth, and changes in texture. Some women also have hair loss during or after pregnancy, or hair that feels dry or thin. Your hair will most likely return to normal after your baby is born.  Your breasts will continue to grow and they will continue to become tender. A yellow fluid (colostrum) may leak from your breasts. This is the first milk you are producing for your baby.  Your belly button may stick out.  You may notice more swelling in your hands,  face, or ankles.  You may have increased tingling or numbness in your hands, arms, and legs. The skin on your belly may also feel numb.  You may feel short of breath because of your expanding uterus.  You may have more problems sleeping. This can be caused by the size of your belly, increased need to urinate, and an increase in your body's metabolism.  You may notice the fetus "dropping," or moving lower in your abdomen (lightening).  You may have increased vaginal discharge.  You may notice your joints feel loose and you may have pain around your pelvic bone. What to expect at prenatal visits You will have prenatal exams every 2 weeks until week 36. Then you will have weekly prenatal exams. During a routine prenatal visit:  You will be weighed to make sure you and the baby are growing normally.  Your blood pressure will be taken.  Your abdomen will be measured to track your baby's growth.  The fetal heartbeat will be listened to.  Any test results from the previous visit will be discussed.  You may have a cervical check near your due date to see if your cervix has softened or thinned (effaced).  You will be tested for Group B streptococcus. This happens between 35 and 37 weeks. Your health care provider may ask you:  What your birth plan is.  How you are feeling.  If you are feeling the baby move.  If you have had any abnormal   symptoms, such as leaking fluid, bleeding, severe headaches, or abdominal cramping.  If you are using any tobacco products, including cigarettes, chewing tobacco, and electronic cigarettes.  If you have any questions. Other tests or screenings that may be performed during your third trimester include:  Blood tests that check for low iron levels (anemia).  Fetal testing to check the health, activity level, and growth of the fetus. Testing is done if you have certain medical conditions or if there are problems during the pregnancy.  Nonstress test  (NST). This test checks the health of your baby to make sure there are no signs of problems, such as the baby not getting enough oxygen. During this test, a belt is placed around your belly. The baby is made to move, and its heart rate is monitored during movement. What is false labor? False labor is a condition in which you feel small, irregular tightenings of the muscles in the womb (contractions) that usually go away with rest, changing position, or drinking water. These are called Braxton Hicks contractions. Contractions may last for hours, days, or even weeks before true labor sets in. If contractions come at regular intervals, become more frequent, increase in intensity, or become painful, you should see your health care provider. What are the signs of labor?  Abdominal cramps.  Regular contractions that start at 10 minutes apart and become stronger and more frequent with time.  Contractions that start on the top of the uterus and spread down to the lower abdomen and back.  Increased pelvic pressure and dull back pain.  A watery or bloody mucus discharge that comes from the vagina.  Leaking of amniotic fluid. This is also known as your "water breaking." It could be a slow trickle or a gush. Let your health care provider know if it has a color or strange odor. If you have any of these signs, call your health care provider right away, even if it is before your due date. Follow these instructions at home: Medicines  Follow your health care provider's instructions regarding medicine use. Specific medicines may be either safe or unsafe to take during pregnancy.  Take a prenatal vitamin that contains at least 600 micrograms (mcg) of folic acid.  If you develop constipation, try taking a stool softener if your health care provider approves. Eating and drinking   Eat a balanced diet that includes fresh fruits and vegetables, whole grains, good sources of protein such as meat, eggs, or tofu,  and low-fat dairy. Your health care provider will help you determine the amount of weight gain that is right for you.  Avoid raw meat and uncooked cheese. These carry germs that can cause birth defects in the baby.  If you have low calcium intake from food, talk to your health care provider about whether you should take a daily calcium supplement.  Eat four or five small meals rather than three large meals a day.  Limit foods that are high in fat and processed sugars, such as fried and sweet foods.  To prevent constipation: ? Drink enough fluid to keep your urine clear or pale yellow. ? Eat foods that are high in fiber, such as fresh fruits and vegetables, whole grains, and beans. Activity  Exercise only as directed by your health care provider. Most women can continue their usual exercise routine during pregnancy. Try to exercise for 30 minutes at least 5 days a week. Stop exercising if you experience uterine contractions.  Avoid heavy lifting.  Do   not exercise in extreme heat or humidity, or at high altitudes.  Wear low-heel, comfortable shoes.  Practice good posture.  You may continue to have sex unless your health care provider tells you otherwise. Relieving pain and discomfort  Take frequent breaks and rest with your legs elevated if you have leg cramps or low back pain.  Take warm sitz baths to soothe any pain or discomfort caused by hemorrhoids. Use hemorrhoid cream if your health care provider approves.  Wear a good support bra to prevent discomfort from breast tenderness.  If you develop varicose veins: ? Wear support pantyhose or compression stockings as told by your healthcare provider. ? Elevate your feet for 15 minutes, 3-4 times a day. Prenatal care  Write down your questions. Take them to your prenatal visits.  Keep all your prenatal visits as told by your health care provider. This is important. Safety  Wear your seat belt at all times when driving.  Make  a list of emergency phone numbers, including numbers for family, friends, the hospital, and police and fire departments. General instructions  Avoid cat litter boxes and soil used by cats. These carry germs that can cause birth defects in the baby. If you have a cat, ask someone to clean the litter box for you.  Do not travel far distances unless it is absolutely necessary and only with the approval of your health care provider.  Do not use hot tubs, steam rooms, or saunas.  Do not drink alcohol.  Do not use any products that contain nicotine or tobacco, such as cigarettes and e-cigarettes. If you need help quitting, ask your health care provider.  Do not use any medicinal herbs or unprescribed drugs. These chemicals affect the formation and growth of the baby.  Do not douche or use tampons or scented sanitary pads.  Do not cross your legs for long periods of time.  To prepare for the arrival of your baby: ? Take prenatal classes to understand, practice, and ask questions about labor and delivery. ? Make a trial run to the hospital. ? Visit the hospital and tour the maternity area. ? Arrange for maternity or paternity leave through employers. ? Arrange for family and friends to take care of pets while you are in the hospital. ? Purchase a rear-facing car seat and make sure you know how to install it in your car. ? Pack your hospital bag. ? Prepare the baby's nursery. Make sure to remove all pillows and stuffed animals from the baby's crib to prevent suffocation.  Visit your dentist if you have not gone during your pregnancy. Use a soft toothbrush to brush your teeth and be gentle when you floss. Contact a health care provider if:  You are unsure if you are in labor or if your water has broken.  You become dizzy.  You have mild pelvic cramps, pelvic pressure, or nagging pain in your abdominal area.  You have lower back pain.  You have persistent nausea, vomiting, or  diarrhea.  You have an unusual or bad smelling vaginal discharge.  You have pain when you urinate. Get help right away if:  Your water breaks before 37 weeks.  You have regular contractions less than 5 minutes apart before 37 weeks.  You have a fever.  You are leaking fluid from your vagina.  You have spotting or bleeding from your vagina.  You have severe abdominal pain or cramping.  You have rapid weight loss or weight gain.  You have   shortness of breath with chest pain.  You notice sudden or extreme swelling of your face, hands, ankles, feet, or legs.  Your baby makes fewer than 10 movements in 2 hours.  You have severe headaches that do not go away when you take medicine.  You have vision changes. Summary  The third trimester is from week 28 through week 40, months 7 through 9. The third trimester is a time when the unborn baby (fetus) is growing rapidly.  During the third trimester, your discomfort may increase as you and your baby continue to gain weight. You may have abdominal, leg, and back pain, sleeping problems, and an increased need to urinate.  During the third trimester your breasts will keep growing and they will continue to become tender. A yellow fluid (colostrum) may leak from your breasts. This is the first milk you are producing for your baby.  False labor is a condition in which you feel small, irregular tightenings of the muscles in the womb (contractions) that eventually go away. These are called Braxton Hicks contractions. Contractions may last for hours, days, or even weeks before true labor sets in.  Signs of labor can include: abdominal cramps; regular contractions that start at 10 minutes apart and become stronger and more frequent with time; watery or bloody mucus discharge that comes from the vagina; increased pelvic pressure and dull back pain; and leaking of amniotic fluid. This information is not intended to replace advice given to you by your  health care provider. Make sure you discuss any questions you have with your health care provider. Document Released: 11/23/2001 Document Revised: 01/04/2017 Document Reviewed: 01/04/2017 Elsevier Interactive Patient Education  2019 [employer]  

## 2019-01-12 NOTE — Progress Notes (Signed)
TDAP & Blood transfusion consent today. No complaints. 

## 2019-01-26 ENCOUNTER — Encounter: Payer: Managed Care, Other (non HMO) | Admitting: Obstetrics & Gynecology

## 2019-01-26 ENCOUNTER — Encounter: Payer: Self-pay | Admitting: Certified Nurse Midwife

## 2019-01-26 ENCOUNTER — Ambulatory Visit (INDEPENDENT_AMBULATORY_CARE_PROVIDER_SITE_OTHER): Payer: Managed Care, Other (non HMO) | Admitting: Certified Nurse Midwife

## 2019-01-26 VITALS — BP 108/60 | Wt 147.0 lb

## 2019-01-26 DIAGNOSIS — O0990 Supervision of high risk pregnancy, unspecified, unspecified trimester: Secondary | ICD-10-CM

## 2019-01-26 DIAGNOSIS — O34219 Maternal care for unspecified type scar from previous cesarean delivery: Secondary | ICD-10-CM

## 2019-01-26 DIAGNOSIS — Z98891 History of uterine scar from previous surgery: Secondary | ICD-10-CM

## 2019-01-26 DIAGNOSIS — Z3A32 32 weeks gestation of pregnancy: Secondary | ICD-10-CM

## 2019-01-26 LAB — POCT URINALYSIS DIPSTICK OB
Glucose, UA: NEGATIVE
POC,PROTEIN,UA: NEGATIVE

## 2019-01-26 NOTE — Progress Notes (Signed)
ROB- no concerns 

## 2019-01-27 NOTE — Progress Notes (Signed)
ROB at 32 weeks: Previous Cesarean section for FTP (arrest at 5-6 cm after labor augmented ). Intrapartum also complicated by chorioamnionitis Baby weighed 7#8oz . She is interested in a TOLAC.  VBAC calculators places success rate between 43%. and 63% DIscussed pros and cons of TOLAC vs repeat Cesarean section. Explained risks utof uterine rupture with a TOLAC at <1%, but that a uterine rupture may result in death to fetus.  Explained risks of repeat Cesarean section to her and for succeeding pregnancies. She is aware that the best chance of vaginal delivery is if she has a spontaneous labor. Mutual decision to schedule for repeat Cesarean section after EDC if no spontaneous labor. [facility] Consent form signed for TOLAC.  Desires to breast feed Baby [place]/ Mirena for contraception  FHTs WNL/ FH 32  ROB in 2 weeks [facility] instructions Scheduled for CS 27 March 2019 if NIL Farrel Conners, [place]

## 2019-01-29 ENCOUNTER — Telehealth: Payer: Self-pay | Admitting: Obstetrics and Gynecology

## 2019-01-29 NOTE — Telephone Encounter (Signed)
Patient is aware of H&P at [facility] on 03/22/19 @ 10:10a.m. w/ Dr. Jean Rosenthal, Pre-admit Testing to be scheduled for 03/26/19, and OR on 03/27/19.

## 2019-01-29 NOTE — Telephone Encounter (Signed)
Lmtrc

## 2019-01-29 NOTE — Telephone Encounter (Signed)
-----   Message from Farrel Conners, [place] sent at 01/27/2019  6:55 PM EST ----- Regarding: scheduling surgery Surgery Booking Request Patient Full Name:  Cindy Aguilar  MRN: [id number]  DOB: 07/17/1996  Surgeon: Dr Jean Rosenthal Requested Surgery Date and Time: April 14 Primary Diagnosis AND Code: Previous Cesarean section Secondary Diagnosis and Code:  Surgical Procedure: Cesarean Section L&D Notification: Yes Admission Status: surgery admit Length of Surgery: 1-2 hours Special Case Needs: no H&P: pending Phone Interview: No Interpreter:no Language: English Medical Clearance: no Special Scheduling Instructions: Wants VBAC, but scheduling surgery if not in labor

## 2019-02-09 ENCOUNTER — Ambulatory Visit (INDEPENDENT_AMBULATORY_CARE_PROVIDER_SITE_OTHER): Payer: Managed Care, Other (non HMO) | Admitting: Advanced Practice Midwife

## 2019-02-09 ENCOUNTER — Encounter: Payer: Self-pay | Admitting: Advanced Practice Midwife

## 2019-02-09 VITALS — BP 112/64 | Wt 152.0 lb

## 2019-02-09 DIAGNOSIS — Z3A34 34 weeks gestation of pregnancy: Secondary | ICD-10-CM

## 2019-02-09 DIAGNOSIS — O34219 Maternal care for unspecified type scar from previous cesarean delivery: Secondary | ICD-10-CM

## 2019-02-09 LAB — POCT URINALYSIS DIPSTICK OB
Glucose, UA: NEGATIVE
POC,PROTEIN,UA: NEGATIVE

## 2019-02-09 NOTE — Progress Notes (Signed)
No vb. No lof.  

## 2019-02-09 NOTE — Addendum Note (Signed)
Addended by: Liliane Shi on: 02/09/2019 01:51 PM   Modules accepted: Orders

## 2019-02-09 NOTE — Progress Notes (Signed)
  Routine Prenatal Care Visit  Subjective  Cindy Aguilar is a 23 y.o. G2P1001 at [redacted]w[redacted]d being seen today for ongoing prenatal care.  She is currently monitored for the following issues for this high-risk pregnancy and has Supervision of high risk pregnancy, antepartum and History of primary cesarean section on their problem list.  ----------------------------------------------------------------------------------- Patient reports no complaints.   Contractions: Not present. Vag. Bleeding: None.  Movement: Present. Denies leaking of fluid.  ----------------------------------------------------------------------------------- The following portions of the patient's history were reviewed and updated as appropriate: allergies, current medications, past family history, past medical history, past social history, past surgical history and problem list. Problem list updated.   Objective  Blood pressure 112/64, weight 152 lb (68.9 kg), last menstrual period 04/28/2018. Pregravid weight 120 lb (54.4 kg) Total Weight Gain 32 lb (14.5 kg) Urinalysis: Urine Protein    Urine Glucose    Fetal Status: Fetal Heart Rate (bpm): 148 Fundal Height: 35 cm Movement: Present     General:  Alert, oriented and cooperative. Patient is in no acute distress.  Skin: Skin is warm and dry. No rash noted.   Cardiovascular: Normal heart rate noted  Respiratory: Normal respiratory effort, no problems with respiration noted  Abdomen: Soft, gravid, appropriate for gestational age. Pain/Pressure: Absent     Pelvic:  Cervical exam deferred        Extremities: Normal range of motion.  Edema: None  Mental Status: Normal mood and affect. Normal behavior. Normal judgment and thought content.   Assessment   23 y.o. G2P1001 at [redacted]w[redacted]d by  03/23/2019, by Ultrasound presenting for routine prenatal visit  Plan   SECOND Problems (from 08/04/18 to present)    Problem Noted Resolved   Supervision of high risk pregnancy, antepartum  08/04/2018 by Tresea Mall, CNM No   Overview Addendum 01/27/2019  6:54 PM by Farrel Conners, CNM    Clinic [facility] Prenatal Labs  Dating  9 wk [place] Blood type: B/Positive/-- (08/23 1449)   Genetic Screen   NIPS:normal xx Antibody:Negative (08/23 1449)  Anatomic [place] complete Rubella: 1.52 (08/23 1449) Varicella: Immune  GTT Early:               Third trimester: 89 RPR: Non Reactive (08/23 1449)   Rhogam Not needed HBsAg: Negative (08/23 1449)   TDaP vaccine    01/12/19                    Flu Shot:09/15/18 HIV: Non Reactive (08/23 1449)   Baby Food      Breast                GBS:   Contraception       Mirena Pap: 10/14/2017, NILM/HPV Negative  CBB     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  Stay hydrated and active Please refer to After Visit Summary for other counseling recommendations.   Return in about 2 weeks (around 02/23/2019) for rob.  Tresea Mall, CNM 02/09/2019 1:48 PM

## 2019-02-23 ENCOUNTER — Other Ambulatory Visit: Payer: Self-pay

## 2019-02-23 ENCOUNTER — Encounter: Payer: Self-pay | Admitting: Maternal Newborn

## 2019-02-23 ENCOUNTER — Other Ambulatory Visit (HOSPITAL_COMMUNITY)
Admission: RE | Admit: 2019-02-23 | Discharge: 2019-02-23 | Disposition: A | Discharge status: Home / Self Care | Payer: Managed Care, Other (non HMO) | Source: Ambulatory Visit | Attending: Maternal Newborn | Admitting: Maternal Newborn

## 2019-02-23 ENCOUNTER — Ambulatory Visit (INDEPENDENT_AMBULATORY_CARE_PROVIDER_SITE_OTHER): Payer: Managed Care, Other (non HMO) | Admitting: Maternal Newborn

## 2019-02-23 VITALS — BP 110/70 | Wt 154.0 lb

## 2019-02-23 DIAGNOSIS — O34219 Maternal care for unspecified type scar from previous cesarean delivery: Secondary | ICD-10-CM

## 2019-02-23 DIAGNOSIS — O0990 Supervision of high risk pregnancy, unspecified, unspecified trimester: Secondary | ICD-10-CM | POA: Insufficient documentation

## 2019-02-23 DIAGNOSIS — Z3A36 36 weeks gestation of pregnancy: Secondary | ICD-10-CM

## 2019-02-23 LAB — POCT URINALYSIS DIPSTICK OB
Glucose, UA: NEGATIVE
POC,PROTEIN,UA: NEGATIVE

## 2019-02-23 NOTE — Progress Notes (Signed)
    Routine Prenatal Care Visit  Subjective  Cindy Aguilar is a 23 y.o. G2P1001 at [redacted]w[redacted]d being seen today for ongoing prenatal care.  She is currently monitored for the following issues for this high-risk pregnancy and has Supervision of high risk pregnancy, antepartum and History of primary cesarean section on their problem list.  ----------------------------------------------------------------------------------- Patient reports Braxton Hicks contractions occurring sporadically throughout the day.   Contractions: Not present. Vag. Bleeding: None.  Movement: Present. No leaking of fluid.  ----------------------------------------------------------------------------------- The following portions of the patient's history were reviewed and updated as appropriate: allergies, current medications, past family history, past medical history, past social history, past surgical history and problem list. Problem list updated.  Objective  Blood pressure 110/70, weight 154 lb (69.9 kg), last menstrual period 04/28/2018. Pregravid weight 120 lb (54.4 kg) Total Weight Gain 34 lb (15.4 kg) Body mass index is 28.17 kg/m.   Urinalysis: Urine dipstick shows negative for glucose, protein.  Fetal Status: Fetal Heart Rate (bpm): 135 Fundal Height: 36 cm Movement: Present     General:  Alert, oriented and cooperative. Patient is in no acute distress.  Skin: Skin is warm and dry. No rash noted.   Cardiovascular: Normal heart rate noted  Respiratory: Normal respiratory effort, no problems with respiration noted  Abdomen: Soft, gravid, appropriate for gestational age. Pain/Pressure: Absent     Pelvic:  Cervical exam performed Dilation: Closed Effacement (%): Thick Station: -2  Extremities: Normal range of motion.  Edema: None  Mental Status: Normal mood and affect. Normal behavior. Normal judgment and thought content.    Assessment   23 y.o. G2P1001 at [redacted]w[redacted]d, EDD 03/23/2019 by Ultrasound presenting for a  routine prenatal visit.  Plan   SECOND Problems (from 08/04/18 to present)    Problem Noted Resolved   Supervision of high risk pregnancy, antepartum 08/04/2018 by Tresea Mall, CNM No   Overview Addendum 01/27/2019  6:54 PM by Farrel Conners, CNM    [facility]inic [facility] Prenatal Labs  Dating  9 wk [place] Blood type: B/Positive/-- (08/23 1449)   Genetic Screen   NIPS:normal xx Antibody:Negative (08/23 1449)  Anatomic [place] complete Rubella: 1.52 (08/23 1449) Varicella: Immune  GTT Early:               Third trimester: 89 RPR: Non Reactive (08/23 1449)   Rhogam Not needed HBsAg: Negative (08/23 1449)   TDaP vaccine    01/12/19                    Flu Shot:09/15/18 HIV: Non Reactive (08/23 1449)   Baby Food      Breast                GBS:   Contraception       Mirena Pap: 10/14/2017, NILM/HPV Negative  CBB     CS/VBAC Primary in 2015/malposition   Support Person Husband Jonny Ruiz           GBS/Aptima today.  Preterm labor symptoms and general obstetric precautions including but not limited to vaginal bleeding, contractions, leaking of fluid and fetal movement were reviewed.  Please refer to After Visit Summary for other counseling recommendations.   Return in about 1 week (around 03/02/2019) for ROB.  Marcelyn Bruins, CNM 02/23/2019

## 2019-02-23 NOTE — Patient Instructions (Signed)
Third Trimester of Pregnancy The third trimester is from week 28 through week 40 (months 7 through 9). The third trimester is a time when the unborn baby (fetus) is growing rapidly. At the end of the ninth month, the fetus is about 20 inches in length and weighs 6-10 pounds. Body changes during your third trimester Your body will continue to go through many changes during pregnancy. The changes vary from woman to woman. During the third trimester:  Your weight will continue to increase. You can expect to gain 25-35 pounds (11-16 kg) by the end of the pregnancy.  You may begin to get stretch marks on your hips, abdomen, and breasts.  You may urinate more often because the fetus is moving lower into your pelvis and pressing on your bladder.  You may develop or continue to have heartburn. This is caused by increased hormones that slow down muscles in the digestive tract.  You may develop or continue to have constipation because increased hormones slow digestion and cause the muscles that push waste through your intestines to relax.  You may develop hemorrhoids. These are swollen veins (varicose veins) in the rectum that can itch or be painful.  You may develop swollen, bulging veins (varicose veins) in your legs.  You may have increased body aches in the pelvis, back, or thighs. This is due to weight gain and increased hormones that are relaxing your joints.  You may have changes in your hair. These can include thickening of your hair, rapid growth, and changes in texture. Some women also have hair loss during or after pregnancy, or hair that feels dry or thin. Your hair will most likely return to normal after your baby is born.  Your breasts will continue to grow and they will continue to become tender. A yellow fluid (colostrum) may leak from your breasts. This is the first milk you are producing for your baby.  Your belly button may stick out.  You may notice more swelling in your hands,  face, or ankles.  You may have increased tingling or numbness in your hands, arms, and legs. The skin on your belly may also feel numb.  You may feel short of breath because of your expanding uterus.  You may have more problems sleeping. This can be caused by the size of your belly, increased need to urinate, and an increase in your body's metabolism.  You may notice the fetus "dropping," or moving lower in your abdomen (lightening).  You may have increased vaginal discharge.  You may notice your joints feel loose and you may have pain around your pelvic bone. What to expect at prenatal visits You will have prenatal exams every 2 weeks until week 36. Then you will have weekly prenatal exams. During a routine prenatal visit:  You will be weighed to make sure you and the baby are growing normally.  Your blood pressure will be taken.  Your abdomen will be measured to track your baby's growth.  The fetal heartbeat will be listened to.  Any test results from the previous visit will be discussed.  You may have a cervical check near your due date to see if your cervix has softened or thinned (effaced).  You will be tested for Group B streptococcus. This happens between 35 and 37 weeks. Your health care provider may ask you:  What your birth plan is.  How you are feeling.  If you are feeling the baby move.  If you have had any abnormal   symptoms, such as leaking fluid, bleeding, severe headaches, or abdominal cramping.  If you are using any tobacco products, including cigarettes, chewing tobacco, and electronic cigarettes.  If you have any questions. Other tests or screenings that may be performed during your third trimester include:  Blood tests that check for low iron levels (anemia).  Fetal testing to check the health, activity level, and growth of the fetus. Testing is done if you have certain medical conditions or if there are problems during the pregnancy.  Nonstress test  (NST). This test checks the health of your baby to make sure there are no signs of problems, such as the baby not getting enough oxygen. During this test, a belt is placed around your belly. The baby is made to move, and its heart rate is monitored during movement. What is false labor? False labor is a condition in which you feel small, irregular tightenings of the muscles in the womb (contractions) that usually go away with rest, changing position, or drinking water. These are called Braxton Hicks contractions. Contractions may last for hours, days, or even weeks before true labor sets in. If contractions come at regular intervals, become more frequent, increase in intensity, or become painful, you should see your health care provider. What are the signs of labor?  Abdominal cramps.  Regular contractions that start at 10 minutes apart and become stronger and more frequent with time.  Contractions that start on the top of the uterus and spread down to the lower abdomen and back.  Increased pelvic pressure and dull back pain.  A watery or bloody mucus discharge that comes from the vagina.  Leaking of amniotic fluid. This is also known as your "water breaking." It could be a slow trickle or a gush. Let your health care provider know if it has a color or strange odor. If you have any of these signs, call your health care provider right away, even if it is before your due date. Follow these instructions at home: Medicines  Follow your health care provider's instructions regarding medicine use. Specific medicines may be either safe or unsafe to take during pregnancy.  Take a prenatal vitamin that contains at least 600 micrograms (mcg) of folic acid.  If you develop constipation, try taking a stool softener if your health care provider approves. Eating and drinking   Eat a balanced diet that includes fresh fruits and vegetables, whole grains, good sources of protein such as meat, eggs, or tofu,  and low-fat dairy. Your health care provider will help you determine the amount of weight gain that is right for you.  Avoid raw meat and uncooked cheese. These carry germs that can cause birth defects in the baby.  If you have low calcium intake from food, talk to your health care provider about whether you should take a daily calcium supplement.  Eat four or five small meals rather than three large meals a day.  Limit foods that are high in fat and processed sugars, such as fried and sweet foods.  To prevent constipation: ? Drink enough fluid to keep your urine clear or pale yellow. ? Eat foods that are high in fiber, such as fresh fruits and vegetables, whole grains, and beans. Activity  Exercise only as directed by your health care provider. Most women can continue their usual exercise routine during pregnancy. Try to exercise for 30 minutes at least 5 days a week. Stop exercising if you experience uterine contractions.  Avoid heavy lifting.  Do   not exercise in extreme heat or humidity, or at high altitudes.  Wear low-heel, comfortable shoes.  Practice good posture.  You may continue to have sex unless your health care provider tells you otherwise. Relieving pain and discomfort  Take frequent breaks and rest with your legs elevated if you have leg cramps or low back pain.  Take warm sitz baths to soothe any pain or discomfort caused by hemorrhoids. Use hemorrhoid cream if your health care provider approves.  Wear a good support bra to prevent discomfort from breast tenderness.  If you develop varicose veins: ? Wear support pantyhose or compression stockings as told by your healthcare provider. ? Elevate your feet for 15 minutes, 3-4 times a day. Prenatal care  Write down your questions. Take them to your prenatal visits.  Keep all your prenatal visits as told by your health care provider. This is important. Safety  Wear your seat belt at all times when driving.  Make  a list of emergency phone numbers, including numbers for family, friends, the hospital, and police and fire departments. General instructions  Avoid cat litter boxes and soil used by cats. These carry germs that can cause birth defects in the baby. If you have a cat, ask someone to clean the litter box for you.  Do not travel far distances unless it is absolutely necessary and only with the approval of your health care provider.  Do not use hot tubs, steam rooms, or saunas.  Do not drink alcohol.  Do not use any products that contain nicotine or tobacco, such as cigarettes and e-cigarettes. If you need help quitting, ask your health care provider.  Do not use any medicinal herbs or unprescribed drugs. These chemicals affect the formation and growth of the baby.  Do not douche or use tampons or scented sanitary pads.  Do not cross your legs for long periods of time.  To prepare for the arrival of your baby: ? Take prenatal classes to understand, practice, and ask questions about labor and delivery. ? Make a trial run to the hospital. ? Visit the hospital and tour the maternity area. ? Arrange for maternity or paternity leave through employers. ? Arrange for family and friends to take care of pets while you are in the hospital. ? Purchase a rear-facing car seat and make sure you know how to install it in your car. ? Pack your hospital bag. ? Prepare the baby's nursery. Make sure to remove all pillows and stuffed animals from the baby's crib to prevent suffocation.  Visit your dentist if you have not gone during your pregnancy. Use a soft toothbrush to brush your teeth and be gentle when you floss. Contact a health care provider if:  You are unsure if you are in labor or if your water has broken.  You become dizzy.  You have mild pelvic cramps, pelvic pressure, or nagging pain in your abdominal area.  You have lower back pain.  You have persistent nausea, vomiting, or  diarrhea.  You have an unusual or bad smelling vaginal discharge.  You have pain when you urinate. Get help right away if:  Your water breaks before 37 weeks.  You have regular contractions less than 5 minutes apart before 37 weeks.  You have a fever.  You are leaking fluid from your vagina.  You have spotting or bleeding from your vagina.  You have severe abdominal pain or cramping.  You have rapid weight loss or weight gain.  You have   shortness of breath with chest pain.  You notice sudden or extreme swelling of your face, hands, ankles, feet, or legs.  Your baby makes fewer than 10 movements in 2 hours.  You have severe headaches that do not go away when you take medicine.  You have vision changes. Summary  The third trimester is from week 28 through week 40, months 7 through 9. The third trimester is a time when the unborn baby (fetus) is growing rapidly.  During the third trimester, your discomfort may increase as you and your baby continue to gain weight. You may have abdominal, leg, and back pain, sleeping problems, and an increased need to urinate.  During the third trimester your breasts will keep growing and they will continue to become tender. A yellow fluid (colostrum) may leak from your breasts. This is the first milk you are producing for your baby.  False labor is a condition in which you feel small, irregular tightenings of the muscles in the womb (contractions) that eventually go away. These are called Braxton Hicks contractions. Contractions may last for hours, days, or even weeks before true labor sets in.  Signs of labor can include: abdominal cramps; regular contractions that start at 10 minutes apart and become stronger and more frequent with time; watery or bloody mucus discharge that comes from the vagina; increased pelvic pressure and dull back pain; and leaking of amniotic fluid. This information is not intended to replace advice given to you by your  health care provider. Make sure you discuss any questions you have with your health care provider. Document Released: 11/23/2001 Document Revised: 01/04/2017 Document Reviewed: 01/04/2017 Elsevier Interactive Patient Education  2019 [employer]  

## 2019-02-23 NOTE — Progress Notes (Signed)
ROB GBS/Aptima 

## 2019-02-25 LAB — STREP GP B NAA: Strep Gp B NAA: NEGATIVE

## 2019-02-27 LAB — CERVICOVAGINAL ANCILLARY ONLY
Chlamydia: NEGATIVE
Neisseria Gonorrhea: NEGATIVE

## 2019-03-02 ENCOUNTER — Encounter: Payer: Self-pay | Admitting: Maternal Newborn

## 2019-03-02 ENCOUNTER — Ambulatory Visit (INDEPENDENT_AMBULATORY_CARE_PROVIDER_SITE_OTHER): Payer: Managed Care, Other (non HMO) | Admitting: Maternal Newborn

## 2019-03-02 ENCOUNTER — Other Ambulatory Visit: Payer: Self-pay

## 2019-03-02 VITALS — BP 110/90 | Wt 156.0 lb

## 2019-03-02 DIAGNOSIS — O0993 Supervision of high risk pregnancy, unspecified, third trimester: Secondary | ICD-10-CM

## 2019-03-02 DIAGNOSIS — O0990 Supervision of high risk pregnancy, unspecified, unspecified trimester: Secondary | ICD-10-CM

## 2019-03-02 DIAGNOSIS — Z3A37 37 weeks gestation of pregnancy: Secondary | ICD-10-CM

## 2019-03-02 LAB — POCT URINALYSIS DIPSTICK OB
Glucose, UA: NEGATIVE
POC,PROTEIN,UA: NEGATIVE

## 2019-03-02 NOTE — Progress Notes (Signed)
ROB- no concerns 2nd BP-110/80

## 2019-03-02 NOTE — Progress Notes (Signed)
    Routine Prenatal Care Visit  Subjective  Cindy Aguilar is a 23 y.o. G2P1001 at [redacted]w[redacted]d being seen today for ongoing prenatal care.  She is currently monitored for the following issues for this high-risk pregnancy and has Supervision of high risk pregnancy, antepartum and History of primary cesarean section on their problem list.  ----------------------------------------------------------------------------------- Patient reports no complaints.   Contractions: Not present. Vag. Bleeding: None.  Movement: Present. No leaking of fluid.  ----------------------------------------------------------------------------------- The following portions of the patient's history were reviewed and updated as appropriate: allergies, current medications, past family history, past medical history, past social history, past surgical history and problem list. Problem list updated.  Objective  Blood pressure 110/90, weight 156 lb (70.8 kg), last menstrual period 04/28/2018. Pregravid weight 120 lb (54.4 kg) Total Weight Gain 36 lb (16.3 kg) Body mass index is 28.53 kg/m.   Urinalysis:Urine dipstick shows negative for glucose, protein.  Fetal Status: Fetal Heart Rate (bpm): 140 Fundal Height: 37 cm Movement: Present     General:  Alert, oriented and cooperative. Patient is in no acute distress.  Skin: Skin is warm and dry. No rash noted.   Cardiovascular: Normal heart rate noted  Respiratory: Normal respiratory effort, no problems with respiration noted  Abdomen: Soft, gravid, appropriate for gestational age. Pain/Pressure: Absent     Pelvic:  Cervical exam deferred        Extremities: Normal range of motion.  Edema: Trace  Mental Status: Normal mood and affect. Normal behavior. Normal judgment and thought content.    Assessment   23 y.o. G2P1001 at [redacted]w[redacted]d, EDD 03/23/2019 by Ultrasound presenting for a routine prenatal visit.  Plan   SECOND Problems (from 08/04/18 to present)    Problem Noted Resolved    Supervision of high risk pregnancy, antepartum 08/04/2018 by Tresea Mall, CNM No   Overview Addendum 01/27/2019  6:54 PM by Farrel Conners, CNM    [facility]inic [facility] Prenatal Labs  Dating  9 wk [place] Blood type: B/Positive/-- (08/23 1449)   Genetic Screen   NIPS:normal xx Antibody:Negative (08/23 1449)  Anatomic [place] complete Rubella: 1.52 (08/23 1449) Varicella: Immune  GTT Early:               Third trimester: 89 RPR: Non Reactive (08/23 1449)   Rhogam Not needed HBsAg: Negative (08/23 1449)   TDaP vaccine    01/12/19                    Flu Shot:09/15/18 HIV: Non Reactive (08/23 1449)   Baby Food      Breast                GBS:   Contraception       Mirena Pap: 10/14/2017, NILM/HPV Negative  CBB     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  Term labor symptoms and general obstetric precautions including but not limited to vaginal bleeding, contractions, leaking of fluid and fetal movement were reviewed.  Please refer to After Visit Summary for other counseling recommendations.   Return in about 1 week (around 03/09/2019) for ROB.  Marcelyn Bruins, CNM 03/02/2019

## 2019-03-05 NOTE — Patient Instructions (Signed)
Third Trimester of Pregnancy The third trimester is from week 28 through week 40 (months 7 through 9). The third trimester is a time when the unborn baby (fetus) is growing rapidly. At the end of the ninth month, the fetus is about 20 inches in length and weighs 6-10 pounds. Body changes during your third trimester Your body will continue to go through many changes during pregnancy. The changes vary from woman to woman. During the third trimester:  Your weight will continue to increase. You can expect to gain 25-35 pounds (11-16 kg) by the end of the pregnancy.  You may begin to get stretch marks on your hips, abdomen, and breasts.  You may urinate more often because the fetus is moving lower into your pelvis and pressing on your bladder.  You may develop or continue to have heartburn. This is caused by increased hormones that slow down muscles in the digestive tract.  You may develop or continue to have constipation because increased hormones slow digestion and cause the muscles that push waste through your intestines to relax.  You may develop hemorrhoids. These are swollen veins (varicose veins) in the rectum that can itch or be painful.  You may develop swollen, bulging veins (varicose veins) in your legs.  You may have increased body aches in the pelvis, back, or thighs. This is due to weight gain and increased hormones that are relaxing your joints.  You may have changes in your hair. These can include thickening of your hair, rapid growth, and changes in texture. Some women also have hair loss during or after pregnancy, or hair that feels dry or thin. Your hair will most likely return to normal after your baby is born.  Your breasts will continue to grow and they will continue to become tender. A yellow fluid (colostrum) may leak from your breasts. This is the first milk you are producing for your baby.  Your belly button may stick out.  You may notice more swelling in your hands,  face, or ankles.  You may have increased tingling or numbness in your hands, arms, and legs. The skin on your belly may also feel numb.  You may feel short of breath because of your expanding uterus.  You may have more problems sleeping. This can be caused by the size of your belly, increased need to urinate, and an increase in your body's metabolism.  You may notice the fetus "dropping," or moving lower in your abdomen (lightening).  You may have increased vaginal discharge.  You may notice your joints feel loose and you may have pain around your pelvic bone. What to expect at prenatal visits You will have prenatal exams every 2 weeks until week 36. Then you will have weekly prenatal exams. During a routine prenatal visit:  You will be weighed to make sure you and the baby are growing normally.  Your blood pressure will be taken.  Your abdomen will be measured to track your baby's growth.  The fetal heartbeat will be listened to.  Any test results from the previous visit will be discussed.  You may have a cervical check near your due date to see if your cervix has softened or thinned (effaced).  You will be tested for Group B streptococcus. This happens between 35 and 37 weeks. Your health care provider may ask you:  What your birth plan is.  How you are feeling.  If you are feeling the baby move.  If you have had any abnormal   symptoms, such as leaking fluid, bleeding, severe headaches, or abdominal cramping.  If you are using any tobacco products, including cigarettes, chewing tobacco, and electronic cigarettes.  If you have any questions. Other tests or screenings that may be performed during your third trimester include:  Blood tests that check for low iron levels (anemia).  Fetal testing to check the health, activity level, and growth of the fetus. Testing is done if you have certain medical conditions or if there are problems during the pregnancy.  Nonstress test  (NST). This test checks the health of your baby to make sure there are no signs of problems, such as the baby not getting enough oxygen. During this test, a belt is placed around your belly. The baby is made to move, and its heart rate is monitored during movement. What is false labor? False labor is a condition in which you feel small, irregular tightenings of the muscles in the womb (contractions) that usually go away with rest, changing position, or drinking water. These are called Braxton Hicks contractions. Contractions may last for hours, days, or even weeks before true labor sets in. If contractions come at regular intervals, become more frequent, increase in intensity, or become painful, you should see your health care provider. What are the signs of labor?  Abdominal cramps.  Regular contractions that start at 10 minutes apart and become stronger and more frequent with time.  Contractions that start on the top of the uterus and spread down to the lower abdomen and back.  Increased pelvic pressure and dull back pain.  A watery or bloody mucus discharge that comes from the vagina.  Leaking of amniotic fluid. This is also known as your "water breaking." It could be a slow trickle or a gush. Let your health care provider know if it has a color or strange odor. If you have any of these signs, call your health care provider right away, even if it is before your due date. Follow these instructions at home: Medicines  Follow your health care provider's instructions regarding medicine use. Specific medicines may be either safe or unsafe to take during pregnancy.  Take a prenatal vitamin that contains at least 600 micrograms (mcg) of folic acid.  If you develop constipation, try taking a stool softener if your health care provider approves. Eating and drinking   Eat a balanced diet that includes fresh fruits and vegetables, whole grains, good sources of protein such as meat, eggs, or tofu,  and low-fat dairy. Your health care provider will help you determine the amount of weight gain that is right for you.  Avoid raw meat and uncooked cheese. These carry germs that can cause birth defects in the baby.  If you have low calcium intake from food, talk to your health care provider about whether you should take a daily calcium supplement.  Eat four or five small meals rather than three large meals a day.  Limit foods that are high in fat and processed sugars, such as fried and sweet foods.  To prevent constipation: ? Drink enough fluid to keep your urine clear or pale yellow. ? Eat foods that are high in fiber, such as fresh fruits and vegetables, whole grains, and beans. Activity  Exercise only as directed by your health care provider. Most women can continue their usual exercise routine during pregnancy. Try to exercise for 30 minutes at least 5 days a week. Stop exercising if you experience uterine contractions.  Avoid heavy lifting.  Do   not exercise in extreme heat or humidity, or at high altitudes.  Wear low-heel, comfortable shoes.  Practice good posture.  You may continue to have sex unless your health care provider tells you otherwise. Relieving pain and discomfort  Take frequent breaks and rest with your legs elevated if you have leg cramps or low back pain.  Take warm sitz baths to soothe any pain or discomfort caused by hemorrhoids. Use hemorrhoid cream if your health care provider approves.  Wear a good support bra to prevent discomfort from breast tenderness.  If you develop varicose veins: ? Wear support pantyhose or compression stockings as told by your healthcare provider. ? Elevate your feet for 15 minutes, 3-4 times a day. Prenatal care  Write down your questions. Take them to your prenatal visits.  Keep all your prenatal visits as told by your health care provider. This is important. Safety  Wear your seat belt at all times when driving.  Make  a list of emergency phone numbers, including numbers for family, friends, the hospital, and police and fire departments. General instructions  Avoid cat litter boxes and soil used by cats. These carry germs that can cause birth defects in the baby. If you have a cat, ask someone to clean the litter box for you.  Do not travel far distances unless it is absolutely necessary and only with the approval of your health care provider.  Do not use hot tubs, steam rooms, or saunas.  Do not drink alcohol.  Do not use any products that contain nicotine or tobacco, such as cigarettes and e-cigarettes. If you need help quitting, ask your health care provider.  Do not use any medicinal herbs or unprescribed drugs. These chemicals affect the formation and growth of the baby.  Do not douche or use tampons or scented sanitary pads.  Do not cross your legs for long periods of time.  To prepare for the arrival of your baby: ? Take prenatal classes to understand, practice, and ask questions about labor and delivery. ? Make a trial run to the hospital. ? Visit the hospital and tour the maternity area. ? Arrange for maternity or paternity leave through employers. ? Arrange for family and friends to take care of pets while you are in the hospital. ? Purchase a rear-facing car seat and make sure you know how to install it in your car. ? Pack your hospital bag. ? Prepare the baby's nursery. Make sure to remove all pillows and stuffed animals from the baby's crib to prevent suffocation.  Visit your dentist if you have not gone during your pregnancy. Use a soft toothbrush to brush your teeth and be gentle when you floss. Contact a health care provider if:  You are unsure if you are in labor or if your water has broken.  You become dizzy.  You have mild pelvic cramps, pelvic pressure, or nagging pain in your abdominal area.  You have lower back pain.  You have persistent nausea, vomiting, or  diarrhea.  You have an unusual or bad smelling vaginal discharge.  You have pain when you urinate. Get help right away if:  Your water breaks before 37 weeks.  You have regular contractions less than 5 minutes apart before 37 weeks.  You have a fever.  You are leaking fluid from your vagina.  You have spotting or bleeding from your vagina.  You have severe abdominal pain or cramping.  You have rapid weight loss or weight gain.  You have   shortness of breath with chest pain.  You notice sudden or extreme swelling of your face, hands, ankles, feet, or legs.  Your baby makes fewer than 10 movements in 2 hours.  You have severe headaches that do not go away when you take medicine.  You have vision changes. Summary  The third trimester is from week 28 through week 40, months 7 through 9. The third trimester is a time when the unborn baby (fetus) is growing rapidly.  During the third trimester, your discomfort may increase as you and your baby continue to gain weight. You may have abdominal, leg, and back pain, sleeping problems, and an increased need to urinate.  During the third trimester your breasts will keep growing and they will continue to become tender. A yellow fluid (colostrum) may leak from your breasts. This is the first milk you are producing for your baby.  False labor is a condition in which you feel small, irregular tightenings of the muscles in the womb (contractions) that eventually go away. These are called Braxton Hicks contractions. Contractions may last for hours, days, or even weeks before true labor sets in.  Signs of labor can include: abdominal cramps; regular contractions that start at 10 minutes apart and become stronger and more frequent with time; watery or bloody mucus discharge that comes from the vagina; increased pelvic pressure and dull back pain; and leaking of amniotic fluid. This information is not intended to replace advice given to you by your  health care provider. Make sure you discuss any questions you have with your health care provider. Document Released: 11/23/2001 Document Revised: 01/04/2017 Document Reviewed: 01/04/2017 Elsevier Interactive Patient Education  2019 [employer]  

## 2019-03-09 ENCOUNTER — Other Ambulatory Visit: Payer: Self-pay

## 2019-03-09 ENCOUNTER — Ambulatory Visit (INDEPENDENT_AMBULATORY_CARE_PROVIDER_SITE_OTHER): Payer: Managed Care, Other (non HMO) | Admitting: Obstetrics and Gynecology

## 2019-03-09 VITALS — BP 114/72 | Wt 161.0 lb

## 2019-03-09 DIAGNOSIS — O34219 Maternal care for unspecified type scar from previous cesarean delivery: Secondary | ICD-10-CM

## 2019-03-09 DIAGNOSIS — O0990 Supervision of high risk pregnancy, unspecified, unspecified trimester: Secondary | ICD-10-CM

## 2019-03-09 DIAGNOSIS — O0993 Supervision of high risk pregnancy, unspecified, third trimester: Secondary | ICD-10-CM

## 2019-03-09 DIAGNOSIS — Z98891 History of uterine scar from previous surgery: Secondary | ICD-10-CM

## 2019-03-09 DIAGNOSIS — Z3A38 38 weeks gestation of pregnancy: Secondary | ICD-10-CM

## 2019-03-09 NOTE — Progress Notes (Signed)
    Routine Prenatal Care Visit  Subjective  Cindy Aguilar is a 23 y.o. G2P1001 at [redacted]w[redacted]d being seen today for ongoing prenatal care.  She is currently monitored for the following issues for this high-risk pregnancy and has Supervision of high risk pregnancy, antepartum and History of primary cesarean section on their problem list.  ----------------------------------------------------------------------------------- Patient reports no complaints.   Contractions: Irregular. Vag. Bleeding: None.  Movement: Present. Denies leaking of fluid.  ----------------------------------------------------------------------------------- The following portions of the patient's history were reviewed and updated as appropriate: allergies, current medications, past family history, past medical history, past social history, past surgical history and problem list. Problem list updated.   Objective  Blood pressure 114/72, weight 161 lb (73 kg), last menstrual period 04/28/2018. Pregravid weight 120 lb (54.4 kg) Total Weight Gain 41 lb (18.6 kg) Urinalysis:      Fetal Status: Fetal Heart Rate (bpm): 140 Fundal Height: 37 cm Movement: Present  Presentation: Vertex  General:  Alert, oriented and cooperative. Patient is in no acute distress.  Skin: Skin is warm and dry. No rash noted.   Cardiovascular: Normal heart rate noted  Respiratory: Normal respiratory effort, no problems with respiration noted  Abdomen: Soft, gravid, appropriate for gestational age. Pain/Pressure: Absent     Pelvic:  Cervical exam performed Dilation: 1 Effacement (%): 50 Station: -3  Extremities: Normal range of motion.     ental Status: Normal mood and affect. Normal behavior. Normal judgment and thought content.     Assessment   23 y.o. G2P1001 at [redacted]w[redacted]d by  03/23/2019, by Ultrasound presenting for routine prenatal visit  Plan   SECOND Problems (from 08/04/18 to present)    Problem Noted Resolved   Supervision of high risk  pregnancy, antepartum 08/04/2018 by Tresea Mall, CNM No   Overview Addendum 03/09/2019  2:08 PM by Vena Austria, MD    [facility]inic [facility] Prenatal Labs  Dating  9 wk [place] Blood type: B/Positive/-- (08/23 1449)   Genetic Screen NIPS:normal xx Antibody:Negative (08/23 1449)  Anatomic [place] complete Rubella: 1.52 (08/23 1449) Varicella: Immune  GTT 89 RPR: Non Reactive (08/23 1449)   Rhogam Not needed HBsAg: Negative (08/23 1449)   TDaP vaccine    01/12/19                    Flu Shot:09/15/18 HIV: Non Reactive (08/23 1449)   Baby Food Breast                [id number]  Contraception Mirena Pap: 10/14/2017, NILM/HPV Negative  CBB     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    Return in about 1 week (around 03/16/2019) for ROB.  Vena Austria, MD, [facility], [facility] 03/09/2019, 2:18 PM

## 2019-03-09 NOTE — Progress Notes (Signed)
ROB

## 2019-03-15 ENCOUNTER — Other Ambulatory Visit: Payer: Self-pay

## 2019-03-15 ENCOUNTER — Ambulatory Visit (INDEPENDENT_AMBULATORY_CARE_PROVIDER_SITE_OTHER): Payer: Managed Care, Other (non HMO) | Admitting: Obstetrics and Gynecology

## 2019-03-15 ENCOUNTER — Encounter: Payer: Self-pay | Admitting: Obstetrics and Gynecology

## 2019-03-15 VITALS — BP 122/80 | Wt 162.0 lb

## 2019-03-15 DIAGNOSIS — O0990 Supervision of high risk pregnancy, unspecified, unspecified trimester: Secondary | ICD-10-CM

## 2019-03-15 DIAGNOSIS — O0993 Supervision of high risk pregnancy, unspecified, third trimester: Secondary | ICD-10-CM

## 2019-03-15 DIAGNOSIS — O34219 Maternal care for unspecified type scar from previous cesarean delivery: Secondary | ICD-10-CM

## 2019-03-15 DIAGNOSIS — Z98891 History of uterine scar from previous surgery: Secondary | ICD-10-CM

## 2019-03-15 DIAGNOSIS — Z3A38 38 weeks gestation of pregnancy: Secondary | ICD-10-CM

## 2019-03-15 NOTE — Progress Notes (Signed)
  Routine Prenatal Care Visit  Subjective  Cindy Aguilar is a 23 y.o. G2P1001 at [redacted]w[redacted]d being seen today for ongoing prenatal care.  She is currently monitored for the following issues for this low-risk pregnancy and has Supervision of high risk pregnancy, antepartum and History of primary cesarean section on their problem list.  ----------------------------------------------------------------------------------- Patient reports no complaints.   Contractions: Irregular. Vag. Bleeding: None.  Movement: Present. Denies leaking of fluid.  ----------------------------------------------------------------------------------- The following portions of the patient's history were reviewed and updated as appropriate: allergies, current medications, past family history, past medical history, past social history, past surgical history and problem list. Problem list updated.   Objective  Blood pressure 122/80, weight 162 lb (73.5 kg), last menstrual period 04/28/2018. Pregravid weight 120 lb (54.4 kg) Total Weight Gain 42 lb (19.1 kg) Urinalysis: Urine Protein    Urine Glucose    Fetal Status: Fetal Heart Rate (bpm): 135 Fundal Height: 38 cm Movement: Present  Presentation: Vertex  General:  Alert, oriented and cooperative. Patient is in no acute distress.  Skin: Skin is warm and dry. No rash noted.   Cardiovascular: Normal heart rate noted  Respiratory: Normal respiratory effort, no problems with respiration noted  Abdomen: Soft, gravid, appropriate for gestational age. Pain/Pressure: Absent     Pelvic:  Cervical exam performed Dilation: 1 Effacement (%): 40 Station: -3  Extremities: Normal range of motion.     Mental Status: Normal mood and affect. Normal behavior. Normal judgment and thought content.   Assessment   23 y.o. G2P1001 at [redacted]w[redacted]d by  03/23/2019, by Ultrasound presenting for routine prenatal visit  Plan   SECOND Problems (from 08/04/18 to present)    Problem Noted Resolved   Supervision of high risk pregnancy, antepartum 08/04/2018 by Tresea Mall, CNM No   Overview Addendum 03/09/2019  2:08 PM by Vena Austria, MD    [facility]inic [facility] Prenatal Labs  Dating  9 wk [place] Blood type: B/Positive/-- (08/23 1449)   Genetic Screen NIPS:normal xx Antibody:Negative (08/23 1449)  Anatomic [place] complete Rubella: 1.52 (08/23 1449) Varicella: Immune  GTT 89 RPR: Non Reactive (08/23 1449)   Rhogam Not needed HBsAg: Negative (08/23 1449)   TDaP vaccine    01/12/19                    Flu Shot:09/15/18 HIV: Non Reactive (08/23 1449)   Baby Food Breast                [id number]  Contraception Mirena Pap: 10/14/2017, NILM/HPV Negative  CBB     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 Please refer to After Visit Summary for other counseling recommendations.   - Reviewed and signed TOLAC form. Discussed risks with patient including risk of uterine rupture and possible fetal death.  She voiced understanding, signed the paperwork, and agrees to proceed.   Return in 6 days (on 03/21/2019) for Keep H&P/ROB with Dr Jean Rosenthal.  Thomasene Mohair, MD, [facility], [facility] 03/15/2019 11:38 AM

## 2019-03-21 ENCOUNTER — Encounter: Payer: Self-pay | Admitting: Obstetrics and Gynecology

## 2019-03-21 ENCOUNTER — Ambulatory Visit (INDEPENDENT_AMBULATORY_CARE_PROVIDER_SITE_OTHER): Payer: Managed Care, Other (non HMO) | Admitting: Obstetrics and Gynecology

## 2019-03-21 ENCOUNTER — Other Ambulatory Visit: Payer: Self-pay

## 2019-03-21 VITALS — BP 122/74 | Ht 63.0 in | Wt 163.0 lb

## 2019-03-21 DIAGNOSIS — Z98891 History of uterine scar from previous surgery: Secondary | ICD-10-CM

## 2019-03-21 DIAGNOSIS — O0990 Supervision of high risk pregnancy, unspecified, unspecified trimester: Secondary | ICD-10-CM

## 2019-03-21 DIAGNOSIS — Z3A39 39 weeks gestation of pregnancy: Secondary | ICD-10-CM

## 2019-03-21 DIAGNOSIS — O34219 Maternal care for unspecified type scar from previous cesarean delivery: Secondary | ICD-10-CM

## 2019-03-21 NOTE — Progress Notes (Signed)
OB History & Physical   History of Present Illness:  Chief Complaint: pre-operative visit for cesarean section  HPI:  Cindy Aguilar is a 23 y.o. G2P1001 female at [redacted]w[redacted]d dated by 9 week ultrasound.  Her pregnancy has been complicated by history of cesarean section.    She reports very occasional contractions.   She denies leakage of fluid.   She denies vaginal bleeding.   She reports fetal movement.    Maternal Medical History:   Past Medical History:  Diagnosis Date  . Cystic dysplasia of one kidney    BENIGN  . Iron deficiency anemia of pregnancy 2015  . PCOS (polycystic ovarian syndrome)    LEFT OVARY ENLARGED    Past Surgical History:  Procedure Laterality Date  . CESAREAN SECTION  2015    No Known Allergies  Prior to Admission medications   Medication Sig Start Date End Date Taking? Authorizing Provider  albuterol (PROVENTIL HFA;VENTOLIN HFA) 108 (90 Base) MCG/ACT inhaler Inhale 2 puffs into the lungs every 6 (six) hours as needed for wheezing or shortness of breath. 11/27/18   McShane, James A, MD  Prenatal Vit-Fe Fumarate-FA (PRENATAL MULTIVITAMIN) TABS tablet Take 1 tablet by mouth daily at 12 noon.    [provider]  Spacer/Aero-Holding Chambers (BREATHERITE COLL SPACER ADULT) MISC Use with your inhaler 11/27/18   McShane, James A, MD    OB History  Gravida Para Term Preterm AB Living  2 1 1     1  SAB TAB Ectopic Multiple Live Births          1    # Outcome Date GA Lbr Len/2nd Weight Sex Delivery Anes PTL Lv  2 Current           1 Term 07/21/14 [redacted]w[redacted]d  7 lb 8 oz (3.402 kg) F CS-LTranv   LIV     Complications: Failure to Progress in First Stage, Chorioamnionitis    Prenatal care site: [facility]  Social History: She  reports that she has never smoked. She has never used smokeless tobacco. She reports that she does not drink alcohol or use drugs.  Family History: family history includes Cancer (age of onset: 53) in her father; Diabetes in her  maternal uncle and maternal uncle; Heart Problems in her maternal uncle.   Review of Systems:  Review of Systems  Constitutional: Negative.   HENT: Negative.   Eyes: Negative.   Respiratory: Negative.   Cardiovascular: Negative.   Gastrointestinal: Negative.   Genitourinary: Negative.   Musculoskeletal: Negative.   Skin: Negative.   Neurological: Negative.   Psychiatric/Behavioral: Negative.      Physical Exam:  Vital Signs: BP 122/74   Ht 5' 3" (1.6 m)   Wt 163 lb (73.9 kg)   LMP 04/28/2018 (Approximate) Comment: iud inserted three yrs ago  BMI 28.87 kg/m  Constitutional: Well nourished, well developed female in no acute distress.  HEENT: normal Skin: Warm and dry.  Cardiovascular: Regular rate and rhythm.   Extremity: no edema  Respiratory: Clear to auscultation bilateral. Normal respiratory effort Abdomen: FHT present and gravid/NT Back: no CVAT Neuro: DTRs 2+, Cranial nerves grossly intact Psych: Alert and Oriented x3. No memory deficits. Normal mood and affect.  MS: normal gait, normal bilateral lower extremity ROM/strength/stability. FHR: 145 bpm  Pertinent Results:  Prenatal Labs: Blood type/Rh B positive  Antibody screen negative  Rubella Immune  Varicella Immune    RPR NR  HBsAg negative  HIV negative  GC negative  Chlamydia   negative  Genetic screening Diploid XX, no msAFP testing  1 hour GTT 89  3 hour GTT n/a  GBS negative on 02/23/2019   Assessment:  CHRISANDRA Aguilar is a 23 y.o. G11P1001 female at [redacted]w[redacted]d with history of cesarean section.   Plan:  1. Admit to Labor & Delivery  2. CBC, T&S, NPO, IVF 3. GBS negative.   4. Fetwal well-being: reassuring 5. Plan for OR if not in labor, unless she has advanced cervical dilation.  In this case, we may consider labor augmentation for delivery.   Thomasene Mohair, MD 03/21/2019 12:16 PM

## 2019-03-21 NOTE — H&P (View-Only) (Signed)
OB History & Physical   History of Present Illness:  Chief Complaint: pre-operative visit for cesarean section  HPI:  Cindy Aguilar is a 23 y.o. 42P1001 female at 6575w5d dated by 9 week ultrasound.  Her pregnancy has been complicated by history of cesarean section.    She reports very occasional contractions.   She denies leakage of fluid.   She denies vaginal bleeding.   She reports fetal movement.    Maternal Medical History:   Past Medical History:  Diagnosis Date  . Cystic dysplasia of one kidney    BENIGN  . Iron deficiency anemia of pregnancy 2015  . PCOS (polycystic ovarian syndrome)    LEFT OVARY ENLARGED    Past Surgical History:  Procedure Laterality Date  . CESAREAN SECTION  2015    No Known Allergies  Prior to Admission medications   Medication Sig Start Date End Date Taking? Authorizing Provider  albuterol (PROVENTIL HFA;VENTOLIN HFA) 108 (90 Base) MCG/ACT inhaler Inhale 2 puffs into the lungs every 6 (six) hours as needed for wheezing or shortness of breath. 11/27/18   Jeanmarie PlantMcShane, James A, MD  Prenatal Vit-Fe Fumarate-FA (PRENATAL MULTIVITAMIN) TABS tablet Take 1 tablet by mouth daily at 12 noon.    [provider]  Spacer/Aero-Holding Chambers (BREATHERITE COLL SPACER ADULT) MISC Use with your inhaler 11/27/18   Jeanmarie PlantMcShane, James A, MD    OB History  Gravida Para Term Preterm AB Living  2 1 1     1   SAB TAB Ectopic Multiple Live Births          1    # Outcome Date GA Lbr Len/2nd Weight Sex Delivery Anes PTL Lv  2 Current           1 Term 07/21/14 7368w0d  7 lb 8 oz (3.402 kg) F CS-LTranv   LIV     Complications: Failure to Progress in First Stage, Chorioamnionitis    Prenatal care site: [facility]  Social History: She  reports that she has never smoked. She has never used smokeless tobacco. She reports that she does not drink alcohol or use drugs.  Family History: family history includes Cancer (age of onset: 4353) in her father; Diabetes in her  maternal uncle and maternal uncle; Heart Problems in her maternal uncle.   Review of Systems:  Review of Systems  Constitutional: Negative.   HENT: Negative.   Eyes: Negative.   Respiratory: Negative.   Cardiovascular: Negative.   Gastrointestinal: Negative.   Genitourinary: Negative.   Musculoskeletal: Negative.   Skin: Negative.   Neurological: Negative.   Psychiatric/Behavioral: Negative.      Physical Exam:  Vital Signs: BP 122/74   Ht 5\' 3"  (1.6 m)   Wt 163 lb (73.9 kg)   LMP 04/28/2018 (Approximate) Comment: iud inserted three yrs ago  BMI 28.87 kg/m  Constitutional: Well nourished, well developed female in no acute distress.  HEENT: normal Skin: Warm and dry.  Cardiovascular: Regular rate and rhythm.   Extremity: no edema  Respiratory: Clear to auscultation bilateral. Normal respiratory effort Abdomen: FHT present and gravid/NT Back: no CVAT Neuro: DTRs 2+, Cranial nerves grossly intact Psych: Alert and Oriented x3. No memory deficits. Normal mood and affect.  MS: normal gait, normal bilateral lower extremity ROM/strength/stability. FHR: 145 bpm  Pertinent Results:  Prenatal Labs: Blood type/Rh B positive  Antibody screen negative  Rubella Immune  Varicella Immune    RPR NR  HBsAg negative  HIV negative  GC negative  Chlamydia  negative  Genetic screening Diploid XX, no msAFP testing  1 hour GTT 89  3 hour GTT n/a  GBS negative on 02/23/2019   Assessment:  Cindy Aguilar is a 23 y.o. G11P1001 female at [redacted]w[redacted]d with history of cesarean section.   Plan:  1. Admit to Labor & Delivery  2. CBC, T&S, NPO, IVF 3. GBS negative.   4. Fetwal well-being: reassuring 5. Plan for OR if not in labor, unless she has advanced cervical dilation.  In this case, we may consider labor augmentation for delivery.   Thomasene Mohair, MD 03/21/2019 12:16 PM

## 2019-03-22 ENCOUNTER — Encounter: Payer: Managed Care, Other (non HMO) | Admitting: Obstetrics and Gynecology

## 2019-03-26 ENCOUNTER — Encounter
Admission: RE | Admit: 2019-03-26 | Discharge: 2019-03-26 | Disposition: A | Discharge status: Home / Self Care | Payer: Managed Care, Other (non HMO) | Source: Ambulatory Visit | Attending: Obstetrics and Gynecology | Admitting: Obstetrics and Gynecology

## 2019-03-26 ENCOUNTER — Other Ambulatory Visit: Payer: Self-pay

## 2019-03-26 ENCOUNTER — Inpatient Hospital Stay: Admission: RE | Admit: 2019-03-26 | Payer: BLUE CROSS/BLUE SHIELD | Source: Ambulatory Visit

## 2019-03-26 DIAGNOSIS — Z01812 Encounter for preprocedural laboratory examination: Secondary | ICD-10-CM | POA: Insufficient documentation

## 2019-03-26 HISTORY — DX: Unspecified asthma, uncomplicated: J45.909

## 2019-03-26 HISTORY — DX: Depression, unspecified: F32.A

## 2019-03-26 HISTORY — DX: Major depressive disorder, single episode, unspecified: F32.9

## 2019-03-26 LAB — CBC
HCT: 36.3 % (ref 36.0–46.0)
Hemoglobin: 11.7 g/dL — ABNORMAL LOW (ref 12.0–15.0)
MCH: 25.8 pg — ABNORMAL LOW (ref 26.0–34.0)
MCHC: 32.2 g/dL (ref 30.0–36.0)
MCV: 80 fL (ref 80.0–100.0)
Platelets: 303 10*3/uL (ref 150–400)
RBC: 4.54 MIL/uL (ref 3.87–5.11)
RDW: 14.4 % (ref 11.5–15.5)
WBC: 13 10*3/uL — ABNORMAL HIGH (ref 4.0–10.5)
nRBC: 0 % (ref 0.0–0.2)

## 2019-03-26 LAB — RAPID HIV SCREEN (HIV 1/2 AB+AG)
HIV 1/2 Antibodies: NONREACTIVE
HIV-1 P24 Antigen - HIV24: NONREACTIVE

## 2019-03-26 MED ORDER — CEFAZOLIN SODIUM-DEXTROSE 2-4 GM/100ML-% IV SOLN
2.0000 g | INTRAVENOUS | Status: AC
  Administered 2019-03-27: 2 g via INTRAVENOUS
  Filled 2019-03-26: qty 100

## 2019-03-26 MED ORDER — CEFAZOLIN SODIUM-DEXTROSE 2-4 GM/100ML-% IV SOLN
2.0000 g | INTRAVENOUS | Status: DC
  Filled 2019-03-26: qty 100

## 2019-03-26 NOTE — Patient Instructions (Signed)
Your procedure is scheduled on: 03-27-19 TUESDAY Report to [facility] FOR SCREENING-THEN PROCEED TO LABOR AND DELIVERY-ARRIVE @ 7:30 AM  Remember: Instructions that are not followed completely may result in serious medical risk, up to and including death, or upon the discretion of your surgeon and anesthesiologist your surgery may need to be rescheduled.    _x___ 1. Do not eat food after midnight the night before your procedure. NO GUM OR CANDY AFTER MIDNIGHT. You may drink clear liquids up to 2 hours before you are scheduled to arrive at the hospital for your procedure.  Do not drink clear liquids within 2 hours of your scheduled arrival to the hospital.  Clear liquids include  --Water or Apple juice without pulp  --Clear carbohydrate beverage such as ClearFast or Gatorade  --Black Coffee or Clear Tea (No milk, no creamers, do not add anything to  the coffee or Tea   ____Ensure clear carbohydrate drink on the way to the hospital for bariatric patients  ____Ensure clear carbohydrate drink 3 hours before surgery for Dr Rutherford Nail patients if physician instructed.    __x__ 2. No Alcohol for 24 hours before or after surgery.   __x__3. No Smoking or e-cigarettes for 24 prior to surgery.  Do not use any chewable tobacco products for at least 6 hour prior to surgery   ____  4. Bring all medications with you on the day of surgery if instructed.    __x__ 5. Notify your doctor if there is any change in your medical condition     (cold, fever, infections).    x___6. On the morning of surgery brush your teeth with toothpaste and water.  You may rinse your mouth with mouth wash if you wish.  Do not swallow any toothpaste or mouthwash.   Do not wear jewelry, make-up, hairpins, clips or nail polish.  Do not wear lotions, powders, or perfumes. You may wear deodorant.  Do not shave 48 hours prior to surgery. Men may shave face and neck.  Do not bring valuables to the hospital.    [facility] is not  responsible for any belongings or valuables.               Contacts, dentures or bridgework may not be worn into surgery.  Leave your suitcase in the car. After surgery it may be brought to your room.  For patients admitted to the hospital, discharge time is determined by your treatment team.  _  Patients discharged the day of surgery will not be allowed to drive home.  You will need someone to drive you home and stay with you the night of your procedure.    Please read over the following fact sheets that you were given:   [facility] Preparing for Surgery   ____ Take anti-hypertensive listed below, cardiac, seizure, asthma, anti-reflux and psychiatric medicines. These include:  1. NONE  2.  3.  4.  5.  6.  ____Fleets enema or Magnesium Citrate as directed.   _x___ Use CHG Soap or sage wipes as directed on instruction sheet   _X___ Use inhalers on the day of surgery and bring to hospital day of surgery-USE YOUR ALBUTEROL INHALER AM OF SURGERY AND BRING TO HOSPITAL  ____ Stop Metformin and Janumet 2 days prior to surgery.    ____ Take 1/2 of usual insulin dose the night before surgery and none on the morning surgery.   ____ Follow recommendations from Cardiologist, Pulmonologist or PCP regarding stopping Aspirin, Coumadin, Plavix ,  Eliquis, Effient, or Pradaxa, and Pletal.  X____Stop Anti-inflammatories such as Advil, Aleve, Ibuprofen, Motrin, Naproxen, Naprosyn, Goodies powders or aspirin products NOW-OK to take Tylenol   ____ Stop supplements until after surgery.    ____ Bring C-Pap to the hospital.

## 2019-03-27 ENCOUNTER — Encounter
Admission: RE | Disposition: A | Discharge status: Home / Self Care | Payer: Self-pay | Source: Home / Self Care | Attending: Obstetrics and Gynecology

## 2019-03-27 ENCOUNTER — Inpatient Hospital Stay: Payer: Managed Care, Other (non HMO) | Admitting: Anesthesiology

## 2019-03-27 ENCOUNTER — Inpatient Hospital Stay
Admission: RE | Admit: 2019-03-27 | Discharge: 2019-03-29 | DRG: 787 | Disposition: A | Discharge status: Home / Self Care | Payer: Managed Care, Other (non HMO) | Attending: Obstetrics and Gynecology | Admitting: Obstetrics and Gynecology

## 2019-03-27 ENCOUNTER — Other Ambulatory Visit: Payer: Self-pay

## 2019-03-27 DIAGNOSIS — O0990 Supervision of high risk pregnancy, unspecified, unspecified trimester: Secondary | ICD-10-CM

## 2019-03-27 DIAGNOSIS — O480 Post-term pregnancy: Secondary | ICD-10-CM

## 2019-03-27 DIAGNOSIS — O34211 Maternal care for low transverse scar from previous cesarean delivery: Principal | ICD-10-CM | POA: Diagnosis present

## 2019-03-27 DIAGNOSIS — D62 Acute posthemorrhagic anemia: Secondary | ICD-10-CM | POA: Diagnosis not present

## 2019-03-27 DIAGNOSIS — Z3A40 40 weeks gestation of pregnancy: Secondary | ICD-10-CM

## 2019-03-27 DIAGNOSIS — Z98891 History of uterine scar from previous surgery: Secondary | ICD-10-CM

## 2019-03-27 DIAGNOSIS — O9081 Anemia of the puerperium: Secondary | ICD-10-CM | POA: Diagnosis not present

## 2019-03-27 DIAGNOSIS — Z3A39 39 weeks gestation of pregnancy: Secondary | ICD-10-CM

## 2019-03-27 DIAGNOSIS — O34219 Maternal care for unspecified type scar from previous cesarean delivery: Secondary | ICD-10-CM

## 2019-03-27 LAB — RPR: RPR Ser Ql: NONREACTIVE

## 2019-03-27 LAB — TYPE AND SCREEN
ABO/RH(D): B POS
Antibody Screen: NEGATIVE
Extend sample reason: UNDETERMINED

## 2019-03-27 LAB — ABO/RH: ABO/RH(D): B POS

## 2019-03-27 SURGERY — Surgical Case
Anesthesia: Spinal

## 2019-03-27 MED ORDER — SCOPOLAMINE 1 MG/3DAYS TD PT72: 1.0000 | MEDICATED_PATCH | Freq: Once | TRANSDERMAL | Status: DC

## 2019-03-27 MED ORDER — NALOXONE HCL 4 MG/10ML IJ SOLN
1.0000 ug/kg/h | INTRAVENOUS | Status: DC | PRN
  Filled 2019-03-27: qty 5

## 2019-03-27 MED ORDER — DIBUCAINE (PERIANAL) 1 % EX OINT: 1.0000 "application " | TOPICAL_OINTMENT | CUTANEOUS | Status: DC | PRN

## 2019-03-27 MED ORDER — OXYTOCIN 40 UNITS IN NORMAL SALINE INFUSION - SIMPLE MED
INTRAVENOUS | Status: AC
  Administered 2019-03-27: 2.5 [IU]/h via INTRAVENOUS
  Filled 2019-03-27: qty 1000

## 2019-03-27 MED ORDER — OXYTOCIN 40 UNITS IN NORMAL SALINE INFUSION - SIMPLE MED
INTRAVENOUS | Status: DC | PRN
  Administered 2019-03-27: 600 mL via INTRAVENOUS

## 2019-03-27 MED ORDER — ONDANSETRON HCL 4 MG/2ML IJ SOLN: 4.0000 mg | Freq: Three times a day (TID) | INTRAMUSCULAR | Status: DC | PRN

## 2019-03-27 MED ORDER — SODIUM CHLORIDE 0.9 % IV SOLN
INTRAVENOUS | Status: DC | PRN
  Administered 2019-03-27: 30 ug/min via INTRAVENOUS

## 2019-03-27 MED ORDER — ONDANSETRON HCL 4 MG/2ML IJ SOLN
INTRAMUSCULAR | Status: DC | PRN
  Administered 2019-03-27: 4 mg via INTRAVENOUS

## 2019-03-27 MED ORDER — SENNOSIDES-DOCUSATE SODIUM 8.6-50 MG PO TABS
2.0000 | ORAL_TABLET | ORAL | Status: DC
  Administered 2019-03-28 – 2019-03-29 (×2): 2 via ORAL
  Filled 2019-03-27 (×2): qty 2

## 2019-03-27 MED ORDER — NALBUPHINE HCL 10 MG/ML IJ SOLN: 5.0000 mg | INTRAMUSCULAR | Status: DC | PRN

## 2019-03-27 MED ORDER — BUPIVACAINE IN DEXTROSE 0.75-8.25 % IT SOLN
INTRATHECAL | Status: DC | PRN
  Administered 2019-03-27: 1.6 mL via INTRATHECAL

## 2019-03-27 MED ORDER — DIPHENHYDRAMINE HCL 25 MG PO CAPS: 25.0000 mg | ORAL_CAPSULE | Freq: Four times a day (QID) | ORAL | Status: DC | PRN

## 2019-03-27 MED ORDER — LACTATED RINGERS IV SOLN
INTRAVENOUS | Status: DC
  Administered 2019-03-27: 08:00:00 via INTRAVENOUS

## 2019-03-27 MED ORDER — DEXAMETHASONE SODIUM PHOSPHATE 4 MG/ML IJ SOLN
INTRAMUSCULAR | Status: DC | PRN
  Administered 2019-03-27: 4 mg via INTRAVENOUS

## 2019-03-27 MED ORDER — NALOXONE HCL 0.4 MG/ML IJ SOLN: 0.4000 mg | INTRAMUSCULAR | Status: DC | PRN

## 2019-03-27 MED ORDER — SODIUM CHLORIDE 0.9% FLUSH: 3.0000 mL | INTRAVENOUS | Status: DC | PRN

## 2019-03-27 MED ORDER — MORPHINE SULFATE (PF) 0.5 MG/ML IJ SOLN
INTRAMUSCULAR | Status: AC
  Filled 2019-03-27: qty 10

## 2019-03-27 MED ORDER — BUPIVACAINE HCL (PF) 0.5 % IJ SOLN
5.0000 mL | Freq: Once | INTRAMUSCULAR | Status: DC
  Filled 2019-03-27: qty 30

## 2019-03-27 MED ORDER — LACTATED RINGERS IV SOLN: Freq: Once | INTRAVENOUS | Status: DC

## 2019-03-27 MED ORDER — ACETAMINOPHEN 500 MG PO TABS
1000.0000 mg | ORAL_TABLET | Freq: Four times a day (QID) | ORAL | Status: AC
  Administered 2019-03-27 – 2019-03-28 (×4): 1000 mg via ORAL
  Filled 2019-03-27 (×4): qty 2

## 2019-03-27 MED ORDER — OXYTOCIN 40 UNITS IN NORMAL SALINE INFUSION - SIMPLE MED
INTRAVENOUS | Status: AC
  Filled 2019-03-27: qty 1000

## 2019-03-27 MED ORDER — SOD CITRATE-CITRIC ACID 500-334 MG/5ML PO SOLN
30.0000 mL | ORAL | Status: AC
  Administered 2019-03-27: 30 mL via ORAL
  Filled 2019-03-27: qty 30

## 2019-03-27 MED ORDER — FENTANYL CITRATE (PF) 100 MCG/2ML IJ SOLN
INTRAMUSCULAR | Status: AC
  Filled 2019-03-27: qty 2

## 2019-03-27 MED ORDER — DIPHENHYDRAMINE HCL 25 MG PO CAPS: 25.0000 mg | ORAL_CAPSULE | ORAL | Status: DC | PRN

## 2019-03-27 MED ORDER — FENTANYL CITRATE (PF) 100 MCG/2ML IJ SOLN
INTRAMUSCULAR | Status: DC | PRN
  Administered 2019-03-27: 15 ug via INTRATHECAL

## 2019-03-27 MED ORDER — WITCH HAZEL-GLYCERIN EX PADS: 1.0000 "application " | MEDICATED_PAD | CUTANEOUS | Status: DC | PRN

## 2019-03-27 MED ORDER — OXYTOCIN 40 UNITS IN NORMAL SALINE INFUSION - SIMPLE MED
2.5000 [IU]/h | INTRAVENOUS | Status: AC
  Administered 2019-03-27 (×2): 2.5 [IU]/h via INTRAVENOUS
  Filled 2019-03-27: qty 1000

## 2019-03-27 MED ORDER — FERROUS SULFATE 325 (65 FE) MG PO TABS
325.0000 mg | ORAL_TABLET | Freq: Two times a day (BID) | ORAL | Status: DC
  Administered 2019-03-27 – 2019-03-29 (×4): 325 mg via ORAL
  Filled 2019-03-27 (×4): qty 1

## 2019-03-27 MED ORDER — OXYCODONE-ACETAMINOPHEN 5-325 MG PO TABS
1.0000 | ORAL_TABLET | ORAL | Status: DC | PRN
  Administered 2019-03-28: 1 via ORAL
  Filled 2019-03-27: qty 1

## 2019-03-27 MED ORDER — IBUPROFEN 600 MG PO TABS
600.0000 mg | ORAL_TABLET | Freq: Four times a day (QID) | ORAL | Status: DC
  Administered 2019-03-28 – 2019-03-29 (×5): 600 mg via ORAL
  Filled 2019-03-27 (×5): qty 1

## 2019-03-27 MED ORDER — BUPIVACAINE 0.25 % ON-Q PUMP DUAL CATH 400 ML
400.0000 mL | INJECTION | Status: DC
  Filled 2019-03-27: qty 400

## 2019-03-27 MED ORDER — NALBUPHINE HCL 10 MG/ML IJ SOLN: 5.0000 mg | Freq: Once | INTRAMUSCULAR | Status: DC | PRN

## 2019-03-27 MED ORDER — DEXAMETHASONE SODIUM PHOSPHATE 4 MG/ML IJ SOLN
INTRAMUSCULAR | Status: AC
  Filled 2019-03-27: qty 1

## 2019-03-27 MED ORDER — COCONUT OIL OIL: 1.0000 "application " | TOPICAL_OIL | Status: DC | PRN

## 2019-03-27 MED ORDER — DIPHENHYDRAMINE HCL 50 MG/ML IJ SOLN: 12.5000 mg | INTRAMUSCULAR | Status: DC | PRN

## 2019-03-27 MED ORDER — MORPHINE SULFATE (PF) 0.5 MG/ML IJ SOLN
INTRAMUSCULAR | Status: DC | PRN
  Administered 2019-03-27: .1 mg via INTRATHECAL

## 2019-03-27 MED ORDER — MENTHOL 3 MG MT LOZG
1.0000 | LOZENGE | OROMUCOSAL | Status: DC | PRN
  Filled 2019-03-27: qty 9

## 2019-03-27 MED ORDER — SIMETHICONE 80 MG PO CHEW
80.0000 mg | CHEWABLE_TABLET | Freq: Three times a day (TID) | ORAL | Status: DC
  Administered 2019-03-27 – 2019-03-29 (×6): 80 mg via ORAL
  Filled 2019-03-27 (×6): qty 1

## 2019-03-27 MED ORDER — PRENATAL MULTIVITAMIN CH
1.0000 | ORAL_TABLET | Freq: Every day | ORAL | Status: DC
  Administered 2019-03-28 – 2019-03-29 (×2): 1 via ORAL
  Filled 2019-03-27 (×2): qty 1

## 2019-03-27 MED ORDER — BUPIVACAINE HCL (PF) 0.5 % IJ SOLN: 5.0000 mL | Freq: Once | INTRAMUSCULAR | Status: DC

## 2019-03-27 MED ORDER — KETOROLAC TROMETHAMINE 30 MG/ML IJ SOLN
30.0000 mg | Freq: Four times a day (QID) | INTRAMUSCULAR | Status: AC
  Administered 2019-03-27 – 2019-03-28 (×3): 30 mg via INTRAVENOUS
  Filled 2019-03-27 (×3): qty 1

## 2019-03-27 MED ORDER — OXYCODONE-ACETAMINOPHEN 5-325 MG PO TABS
2.0000 | ORAL_TABLET | ORAL | Status: DC | PRN
  Administered 2019-03-28 – 2019-03-29 (×4): 2 via ORAL
  Filled 2019-03-27 (×4): qty 2

## 2019-03-27 MED ORDER — LACTATED RINGERS IV SOLN: INTRAVENOUS | Status: DC

## 2019-03-27 MED ORDER — BUPIVACAINE HCL 0.5 % IJ SOLN
INTRAMUSCULAR | Status: DC | PRN
  Administered 2019-03-27: 10 mL

## 2019-03-27 MED ORDER — MEPERIDINE HCL 25 MG/ML IJ SOLN: 6.2500 mg | INTRAMUSCULAR | Status: DC | PRN

## 2019-03-27 SURGICAL SUPPLY — 33 items
CANISTER SUCT 3000ML PPV (MISCELLANEOUS) ×3 IMPLANT
CATH KIT ON-Q SILVERSOAK 5IN (CATHETERS) ×6 IMPLANT
CLOSURE WOUND 1/2 X4 (GAUZE/BANDAGES/DRESSINGS) ×1
COVER WAND RF STERILE (DRAPES) ×3 IMPLANT
DERMABOND ADVANCED (GAUZE/BANDAGES/DRESSINGS) ×2
DERMABOND ADVANCED .7 DNX12 (GAUZE/BANDAGES/DRESSINGS) ×1 IMPLANT
DRSG OPSITE POSTOP 4X10 (GAUZE/BANDAGES/DRESSINGS) ×3 IMPLANT
DRSG TEGADERM 4X4.75 (GAUZE/BANDAGES/DRESSINGS) ×3 IMPLANT
DRSG TELFA 3X8 NADH (GAUZE/BANDAGES/DRESSINGS) ×3 IMPLANT
ELECT CAUTERY BLADE 6.4 (BLADE) ×3 IMPLANT
ELECT REM PT RETURN 9FT ADLT (ELECTROSURGICAL) ×3
ELECTRODE REM PT RTRN 9FT ADLT (ELECTROSURGICAL) ×1 IMPLANT
GAUZE SPONGE 4X4 12PLY STRL (GAUZE/BANDAGES/DRESSINGS) ×3 IMPLANT
GLOVE BIO SURGEON STRL SZ7 (GLOVE) ×3 IMPLANT
GLOVE INDICATOR 7.5 STRL GRN (GLOVE) ×3 IMPLANT
GOWN STRL REUS W/ TWL LRG LVL3 (GOWN DISPOSABLE) ×3 IMPLANT
GOWN STRL REUS W/TWL LRG LVL3 (GOWN DISPOSABLE) ×6
NS IRRIG 1000ML POUR BTL (IV SOLUTION) ×3 IMPLANT
PACK C SECTION AR (MISCELLANEOUS) ×3 IMPLANT
PAD OB MATERNITY 4.3X12.25 (PERSONAL CARE ITEMS) ×6 IMPLANT
PAD PREP 24X41 OB/GYN DISP (PERSONAL CARE ITEMS) ×3 IMPLANT
PENCIL SMOKE ULTRAEVAC 22 CON (MISCELLANEOUS) ×3 IMPLANT
STRIP CLOSURE SKIN 1/2X4 (GAUZE/BANDAGES/DRESSINGS) ×2 IMPLANT
SUT MNCRL 4-0 (SUTURE) ×2
SUT MNCRL 4-0 27XMFL (SUTURE) ×1
SUT PDS AB 1 TP1 96 (SUTURE) ×3 IMPLANT
SUT PLAIN GUT 0 (SUTURE) IMPLANT
SUT VIC AB 0 CTX 36 (SUTURE) ×6
SUT VIC AB 0 CTX36XBRD ANBCTRL (SUTURE) ×3 IMPLANT
SUT VIC AB 3-0 SH 27 (SUTURE) ×2
SUT VIC AB 3-0 SH 27X BRD (SUTURE) ×1 IMPLANT
SUTURE MNCRL 4-0 27XMF (SUTURE) ×1 IMPLANT
SWABSTK COMLB BENZOIN TINCTURE (MISCELLANEOUS) ×3 IMPLANT

## 2019-03-27 NOTE — Lactation Note (Signed)
This note was copied from a baby's chart. Lactation Consultation Note  Patient Name: Cindy Aguilar [id number] Date: 03/27/2019 Reason for consult: Initial assessment;1st time breastfeeding   Maternal Data Has patient been taught Hand Expression?: Yes Does the patient have breastfeeding experience prior to this delivery?: No  Feeding Feeding Type: Breast Fed  LATCH Score Latch: Repeated attempts needed to sustain latch, nipple held in mouth throughout feeding, stimulation needed to elicit sucking reflex.  Audible Swallowing: A few with stimulation  Type of Nipple: Flat  Comfort (Breast/Nipple): Soft / non-tender  Hold (Positioning): Assistance needed to correctly position infant at breast and maintain latch.  LATCH Score: 6  Interventions Interventions: Breast feeding basics reviewed;Assisted with latch;Skin to skin;Hand express;Reverse pressure;Adjust position  Lactation Tools Discussed/Used     Consult Status Consult Status: Follow-up Date: 03/27/19 Follow-up type: In-patient MOB states that breastfeeding is going well and that baby latched on to opposite breast for approx. before LC entered room. Baby was still cueing so LC asked if she could assist with breastfeeding. MOB has flatter nipple on right side but it could be from fluids, she says that it normally comes out. LC may give parent's nipple shield before end of shift in case it is needed after LC availability.   LC did an overview of: milk supply, positioning, latching, feeding cues, clusterfeeding, intake/output, knowing when baby is satisfied with family. Family took breastfeeding class in Feb.   LC will check on patient before end of shift to ensure bf is still going smoothly.    Burnadette Peter 03/27/2019, 3:00 PM

## 2019-03-27 NOTE — Transfer of Care (Signed)
Immediate Anesthesia Transfer of Care Note  Patient: Cindy Aguilar  Procedure(s) Performed: CESAREAN SECTION (N/A )  Patient Location: PACU  Anesthesia Type:Spinal  Level of Consciousness: awake, alert  and oriented  Airway & Oxygen Therapy: Patient Spontanous Breathing  Post-op Assessment: Post -op Vital signs reviewed and stable  Post vital signs: stable  Last Vitals:  Vitals Value Taken Time  BP 102/63 03/27/2019  1:15 PM  Temp    Pulse 76 03/27/2019  1:15 PM  Resp 12 03/27/2019  1:15 PM  SpO2 100 % 03/27/2019  1:15 PM    Last Pain:  Vitals:   03/27/19 1025  TempSrc: Oral  PainSc:          Complications: No apparent anesthesia complications

## 2019-03-27 NOTE — Anesthesia Procedure Notes (Addendum)
Spinal  Patient location during procedure: OR Staffing Anesthesiologist: Fitzgerald, Kathryn L, MD Resident/CRNA: Cheyane Ayon, CRNA Performed: resident/CRNA  Preanesthetic Checklist Completed: patient identified, site marked, surgical consent, pre-op evaluation, timeout performed, IV checked, risks and benefits discussed and monitors and equipment checked Spinal Block Patient position: sitting Prep: ChloraPrep and site prepped and draped Patient monitoring: heart rate, continuous pulse ox, blood pressure and cardiac monitor Approach: midline Location: L4-5 Injection technique: single-shot Needle Needle type: Introducer and Pencan  Needle gauge: 24 G Needle length: 9 cm Additional Notes Negative paresthesia. Negative blood return. Positive free-flowing CSF. Expiration date of kit checked and confirmed. Patient tolerated procedure well, without complications.       

## 2019-03-27 NOTE — Lactation Note (Signed)
This note was copied from a baby's chart. Lactation Consultation Note  Patient Name: Cindy Aguilar [id number] Date: 03/27/2019 Reason for consult: Initial assessment;1st time breastfeeding   Maternal Data Has patient been taught Hand Expression?: Yes Does the patient have breastfeeding experience prior to this delivery?: No  Feeding Feeding Type: Breast Fed  LATCH Score Latch: Repeated attempts needed to sustain latch, nipple held in mouth throughout feeding, stimulation needed to elicit sucking reflex.  Audible Swallowing: A few with stimulation  Type of Nipple: Flat  Comfort (Breast/Nipple): Soft / non-tender  Hold (Positioning): Assistance needed to correctly position infant at breast and maintain latch.  LATCH Score: 6  Interventions Interventions: Breast feeding basics reviewed;Assisted with latch;Skin to skin;Hand express;Reverse pressure;Adjust position  Lactation Tools Discussed/Used     Consult Status Consult Status: Follow-up Date: 03/27/19 Follow-up type: In-patient    Burnadette Peter 03/27/2019, 2:34 PM

## 2019-03-27 NOTE — Anesthesia Post-op Follow-up Note (Signed)
Anesthesia QCDR form completed.        

## 2019-03-27 NOTE — Interval H&P Note (Signed)
History and Physical Interval Note:  03/27/2019 9:14 AM  Cindy Aguilar  has presented today for surgery, with the diagnosis of PREVIOUS CESAREAN SECTION.  The various methods of treatment have been discussed with the patient and family. After consideration of risks, benefits and other options for treatment, the patient has consented to  Procedure(s): CESAREAN SECTION (N/A) as a surgical intervention.  The patient's history has been reviewed, patient examined, no change in status, stable for surgery.  I have reviewed the patient's chart and labs.  Questions were answered to the patient's satisfaction.  Consents reviewed and patient wishes to proceed.   Discussed induction of labor. Cervical exam: 1/40/-2 with female chaperone present.  Given this, she does not wish to proceed with induction of labor.  Will proceed with scheduled cesarean section.  Thomasene Mohair, MD, [facility], [facility] 03/27/2019 9:16 AM

## 2019-03-27 NOTE — Anesthesia Preprocedure Evaluation (Addendum)
Anesthesia Evaluation  Patient identified by MRN, date of birth, ID band Patient awake    Reviewed: Allergy & Precautions, H&P , NPO status , Patient's Chart, lab work & pertinent test results  Airway Mallampati: II  TM Distance: >3 FB     Dental  (+) Teeth Intact   Pulmonary asthma ,           Cardiovascular Exercise Tolerance: Good negative cardio ROS       Neuro/Psych PSYCHIATRIC DISORDERS Depression negative neurological ROS     GI/Hepatic negative GI ROS,   Endo/Other    Renal/GU Renal disease (cystic dysplasia of one kidney)  negative genitourinary   Musculoskeletal   Abdominal   Peds  Hematology  (+) Blood dyscrasia, anemia ,   Anesthesia Other Findings Past Medical History: No date: Asthma     Comment:  well controlled No date: Cystic dysplasia of one kidney     Comment:  BENIGN No date: Depression     Comment:  no meds 2015: Iron deficiency anemia of pregnancy No date: PCOS (polycystic ovarian syndrome)     Comment:  LEFT OVARY ENLARGED  Past Surgical History: 2015: CESAREAN SECTION     Reproductive/Obstetrics (+) Pregnancy                            Anesthesia Physical Anesthesia Plan  ASA: III  Anesthesia Plan: Spinal   Post-op Pain Management:    Induction:   PONV Risk Score and Plan:   Airway Management Planned:   Additional Equipment:   Intra-op Plan:   Post-operative Plan:   Informed Consent: I have reviewed the patients History and Physical, chart, labs and discussed the procedure including the risks, benefits and alternatives for the proposed anesthesia with the patient or authorized representative who has indicated his/her understanding and acceptance.     Dental Advisory Given  Plan Discussed with: Anesthesiologist  Anesthesia Plan Comments:         Anesthesia Quick Evaluation

## 2019-03-27 NOTE — Op Note (Signed)
Cesarean Section Operative Note    Cindy PearlCasey M Aguilar   03/27/2019   Pre-operative Diagnosis:  1) history of cesarean section, desires repeat 2) intrauterine pregnancy at 427w4d   Post-operative Diagnosis:  1) history of cesarean section, desires repeat 2) intrauterine pregnancy at 1727w4d    Procedure: Repeat Low Transverse Cesarean Section via Pfannenstiel incision with double layer uterine closure.   Surgeon: Moishe SpiceSurgeon(s) and Role:    * Conard NovakJackson, Ajamu Maxon D, MD - Primary   Assistants:  Farrel Connersolleen Gutierrez, CNM; No other capable assistant available, in surgery requiring high level assistant.  Anesthesia: spinal   Findings:  1) normal appearing gravid uterus, fallopian tubes, and ovaries 2) Viable female infant with weight 3,940 grams, APGARs 9 and 9   Estimated Blood Loss: 700 mL  Total IV Fluids: 1,500 ml   Urine Output: 50 mL clear urine at end of case  Specimens: none  Complications: no complications  Disposition: PACU - hemodynamically stable.   Maternal Condition: stable   Baby condition / location:  Couplet care / Skin to Skin  Procedure Details:  The patient was seen in the Holding Room. The risks, benefits, complications, treatment options, and expected outcomes were discussed with the patient. The patient concurred with the proposed plan, giving informed consent. identified as Cindy Aguilar and the procedure verified as C-Section Delivery. A Time Out was held and the above information confirmed.   After induction of anesthesia, the patient was draped and prepped in the usual sterile manner. A Pfannenstiel incision was made and carried down through the subcutaneous tissue to the fascia. Fascial incision was made and extended transversely. The fascia was separated from the underlying rectus tissue superiorly and inferiorly. The peritoneum was identified and entered. Peritoneal incision was extended longitudinally. The bladder flap was not bluntly or sharply freed from the  lower uterine segment. A low transverse uterine incision was made and the hysterotomy was extended with cranial-caudal tension. Delivered from cephalic presentation was a 3,940 gram Living newborn infant(s) or Female with Apgar scores of 9 at one minute and 9 at five minutes. Cord ph was not sent the umbilical cord was clamped and cut cord blood was not obtained for evaluation. The placenta was removed Intact and appeared normal. The uterine outline, tubes and ovaries appeared normal. The uterine incision was closed with running locked sutures of 0 Vicryl.  A second layer of the same suture was thrown in an imbricating fashion.  A small running 0-vicryl stitch was needed just right of center of the hysterotomy for about 2 cm.  Hemostasis was assured.  The uterus was returned to the abdomen and the paracolic gutters were cleared of all clots and debris.  The rectus muscles were inspected and found to be hemostatic.  The On-Q catheter pumps were inserted in accordance with the manufacturer's recommendations.  The catheters were inserted approximately 4cm cephelad to the incision line, approximately 1cm apart, straddling the midline.  They were inserted to a depth of the 4th mark. They were positioned superficial to the rectus abdominus muscles and deep to the rectus fascia.    The fascia was then reapproximated with running sutures of 1-0 PDS, looped. The subcutaneous tissue was re-approximated using 3-0 vicryl in a running fashion to decrease tension on the skin closure. The subcuticular closure was performed using 4-0 monocryl. The skin closure was reinforced using surgical skin glue.  The On-Q catheters were bolused with 5 mL of 0.5% marcaine plain for a total of 10 mL.  The catheters were affixed to the skin with surgical skin glue, steri-strips, and tegaderm.    Instrument, sponge, and needle counts were correct prior the abdominal closure and were correct at the conclusion of the case.  The patient  received Ancef 2 gram IV prior to skin incision (within 30 minutes). For VTE prophylaxis she was wearing SCDs throughout the case.  The assistant surgeon was a CNM due to lack of availability of another Sales promotion account executive.   Signed: Conard Novak, MD 03/27/2019 1:01 PM

## 2019-03-27 NOTE — Discharge Summary (Signed)
OB Discharge Summary     Patient Name: Cindy Aguilar DOB: 02-22-1996 MRN: [id number]  Date of admission: 03/27/2019 Delivering MD: Thomasene Mohair, MD  Date of Delivery: 03/27/2019  Date of discharge: 03/29/2019  Admitting diagnosis: PREVIOUS CESAREAN SECTION Intrauterine pregnancy: [redacted]w[redacted]d     Secondary diagnosis: None     Discharge diagnosis: Term Pregnancy Delivered                                                                                                Post partum procedures:none  Augmentation: n/a  Complications: None  Hospital course:  Sceduled C/S   23 y.o. yo G2P2002 at [redacted]w[redacted]d was admitted to the hospital 03/27/2019 for scheduled cesarean section with the following indication:Elective Repeat.  Membrane Rupture Time/Date: 12:13 PM ,03/27/2019   Patient delivered a Viable infant.03/27/2019  Details of operation can be found in separate operative note.  Pateint had an uncomplicated postpartum course.  She is ambulating, tolerating a regular diet, passing flatus, and urinating well. Patient is discharged home in stable condition on  03/29/19         Physical exam  Vitals:   03/28/19 1947 03/28/19 2319 03/29/19 0330 03/29/19 0811  BP: 122/79 117/83 111/65 106/83  Pulse: 77 69 80 74  Resp: 20 18 18 16   Temp: 99.2 F (37.3 C) (!) 97.4 F (36.3 C) 98.4 F (36.9 C) (!) 97.5 F (36.4 C)  TempSrc: Axillary Oral Oral Oral  SpO2: 99% 99% 99% 100%  Weight:      Height:       General: alert, cooperative and no distress Lochia: appropriate Uterine Fundus: firm Incision: Healing well with no significant drainage, No significant erythema, Dressing is clean, dry, and intact DVT Evaluation: No evidence of DVT seen on physical exam. Negative Homan's sign. No cords or calf tenderness. No significant calf/ankle edema.  Labs: Lab Results  Component Value Date   WBC 15.5 (H) 03/28/2019   HGB 9.1 (L) 03/28/2019   HCT 28.5 (L) 03/28/2019   MCV 81.2 03/28/2019   PLT 251  03/28/2019    Discharge instruction: per After Visit Summary.  Medications:  Allergies as of 03/29/2019   No Known Allergies     Medication List    TAKE these medications   albuterol 108 (90 Base) MCG/ACT inhaler Commonly known as:  PROVENTIL HFA;VENTOLIN HFA Inhale 2 puffs into the lungs every 6 (six) hours as needed for wheezing or shortness of breath.   BreatheRite Coll Spacer Adult Misc Use with your inhaler   ferrous sulfate 325 (65 FE) MG tablet Take 1 tablet (325 mg total) by mouth 2 (two) times daily with a meal.   oxyCODONE-acetaminophen 5-325 MG tablet Commonly known as:  PERCOCET/ROXICET Take 1 tablet by mouth every 4 (four) hours as needed for moderate pain (pain score 4-7/10).   prenatal multivitamin Tabs tablet Take 1 tablet by mouth daily at 12 noon.       Diet: routine diet  Activity: Advance as tolerated. Pelvic rest for 6 weeks.   Outpatient follow up: Follow-up Information    Conard Novak, MD. Go in  2 week(s).   Specialty:  Obstetrics and Gynecology Why:  POST OP Contact information: [address] [place] [address] [phone]             Postpartum contraception: IUD Mirena Rhogam Given postpartum: no Rubella vaccine given postpartum: no Varicella vaccine given postpartum: no TDaP given antepartum or postpartum: 01/12/2019 Influenza vaccine given: 09/15/2018  Newborn Data: Live born female  Birth Weight: 8 lb 11 oz (3940 g) APGAR: 9, 9  Newborn Delivery   Birth date/time:  03/27/2019 12:13:00 Delivery type:  C-Section, Low Transverse Trial of labor:  No C-section categorization:  Repeat    Baby Feeding: Breast  Disposition:home with mother  SIGNED: Annamarie MajorPaul Taylon Coole, MD, Merlinda FrederickFACOG [facility], [facility] 03/29/2019  10:58 AM

## 2019-03-28 ENCOUNTER — Telehealth: Payer: Self-pay

## 2019-03-28 LAB — CBC
HCT: 28.5 % — ABNORMAL LOW (ref 36.0–46.0)
Hemoglobin: 9.1 g/dL — ABNORMAL LOW (ref 12.0–15.0)
MCH: 25.9 pg — ABNORMAL LOW (ref 26.0–34.0)
MCHC: 31.9 g/dL (ref 30.0–36.0)
MCV: 81.2 fL (ref 80.0–100.0)
Platelets: 251 10*3/uL (ref 150–400)
RBC: 3.51 MIL/uL — ABNORMAL LOW (ref 3.87–5.11)
RDW: 14.3 % (ref 11.5–15.5)
WBC: 15.5 10*3/uL — ABNORMAL HIGH (ref 4.0–10.5)
nRBC: 0 % (ref 0.0–0.2)

## 2019-03-28 IMAGING — US US PELVIS COMPLETE TRANSABD/TRANSVAG
1 series · 13 of 25 positions shown · non-contrast
Comparison: No prior non obstetric pelvic sonogram.

CLINICAL DATA: 22-year-old female with reported history of uterine
bleeding. History of Mirena IUD.

EXAM:
TRANSABDOMINAL AND TRANSVAGINAL ULTRASOUND OF PELVIS
TECHNIQUE: Both transabdominal and transvaginal ultrasound examinations of the
pelvis were performed. Transabdominal technique was performed for
global imaging of the pelvis including uterus, ovaries, adnexal
regions, and pelvic cul-de-sac. It was necessary to proceed with
endovaginal exam following the transabdominal exam to visualize the
endometrium and adnexa.

[Series 1: us pelvis complete transabd/transvag · 0.20mm/px · 13 of 100 slices shown]
[im 1/100]
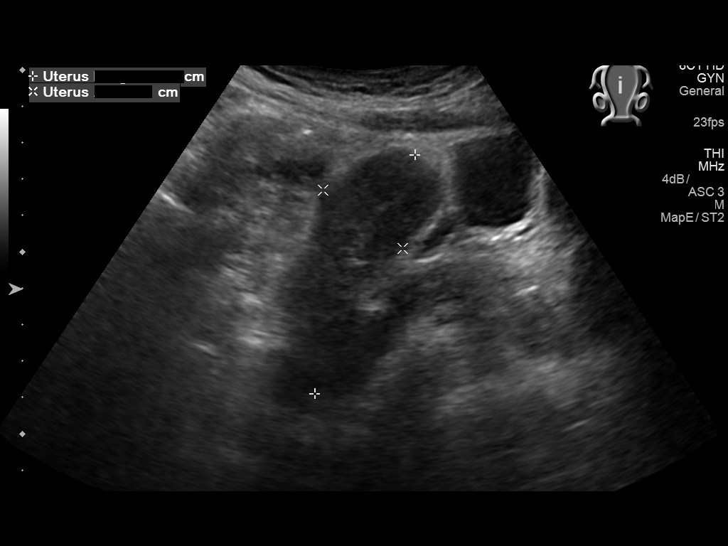
[im 9/100]
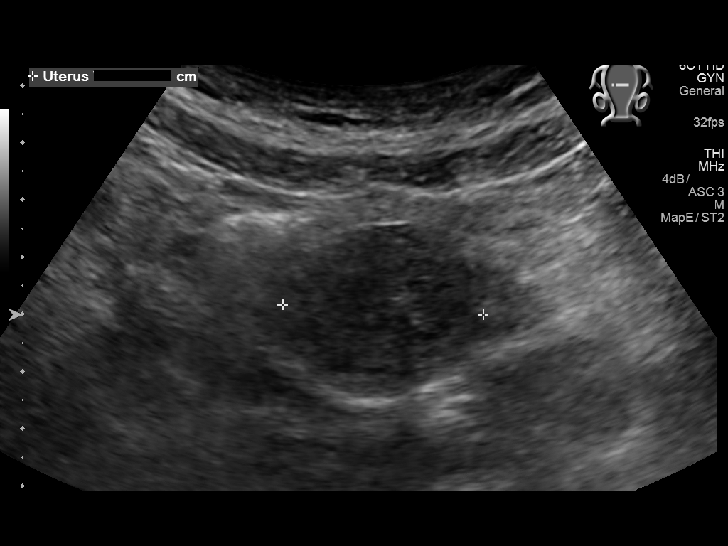
[im 17/100]
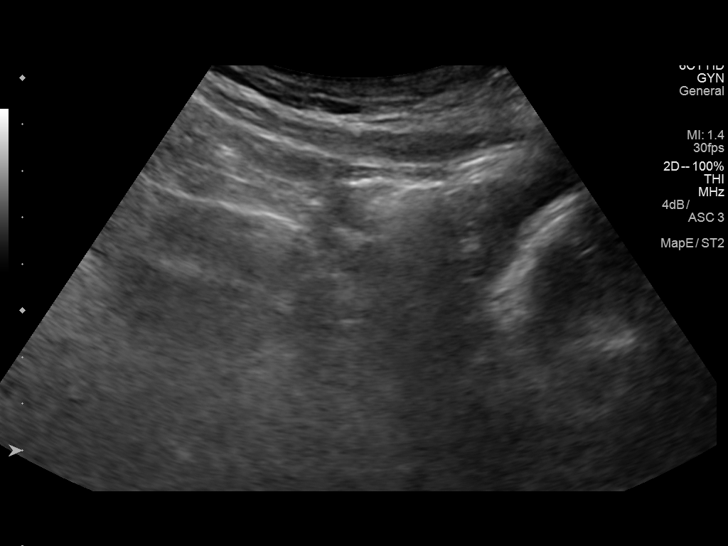
[im 25/100]
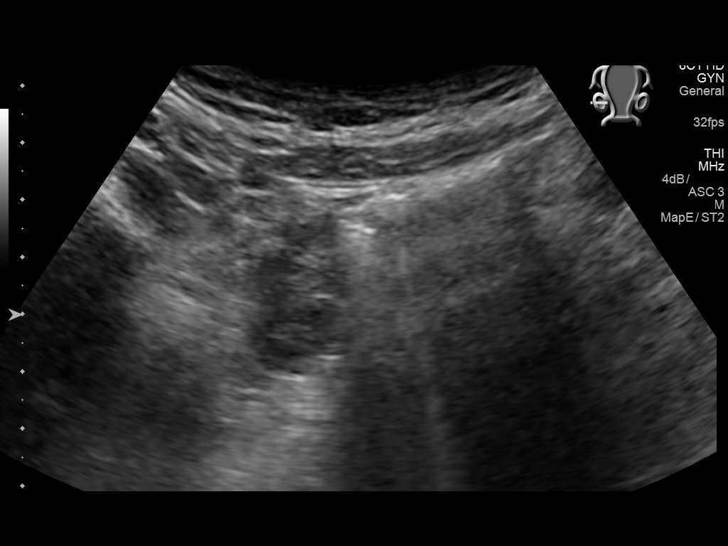
[im 34/100]
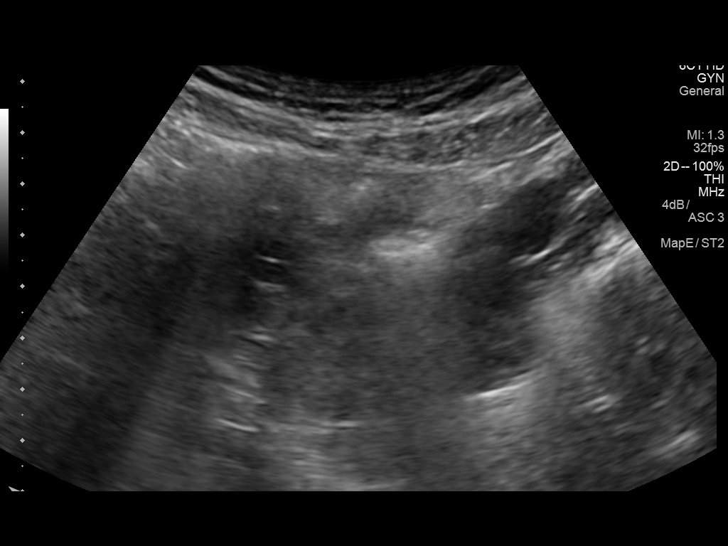
[im 42/100]
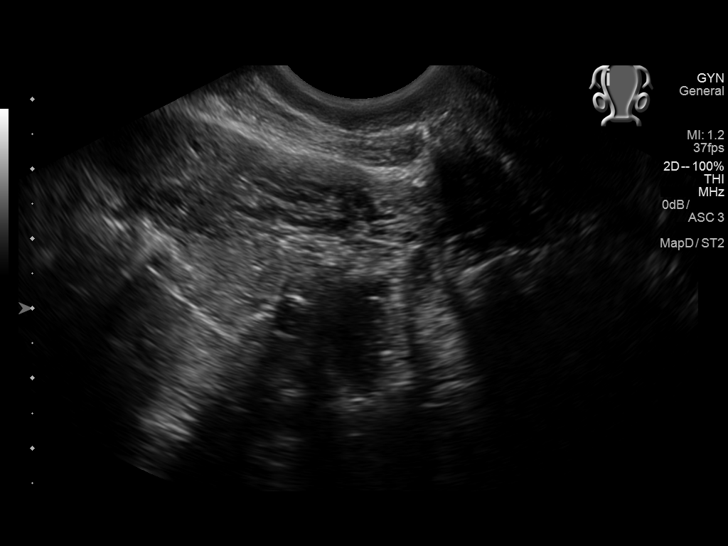
[im 50/100]
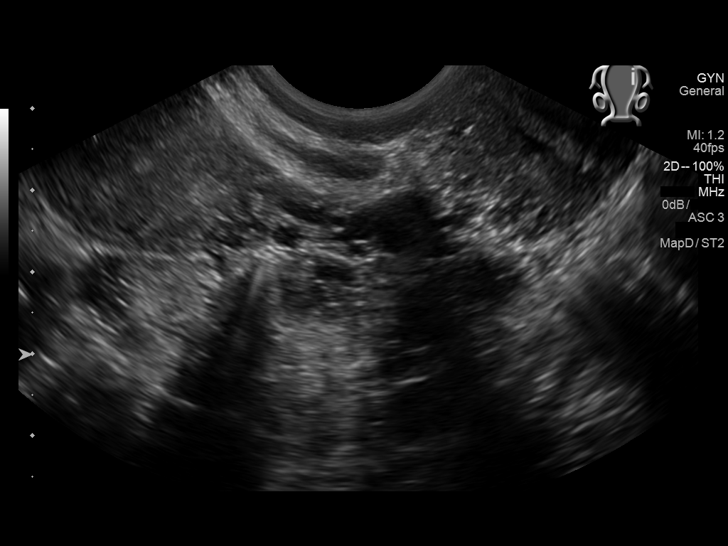
[im 58/100]
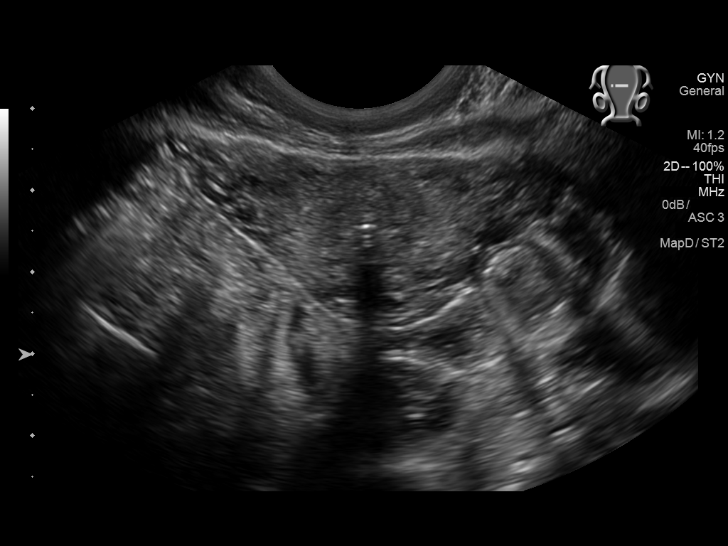
[im 67/100]
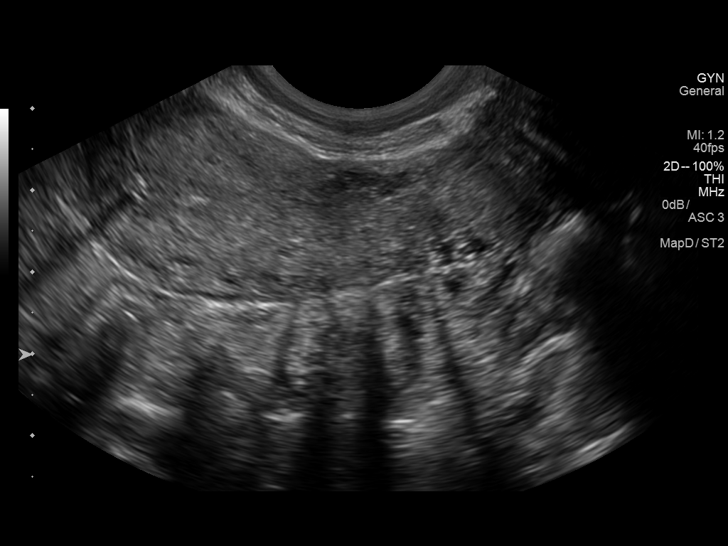
[im 75/100]
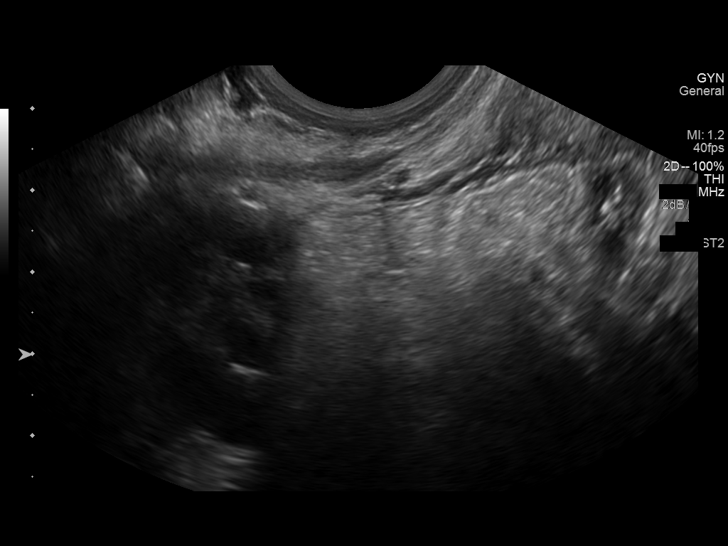
[im 83/100]
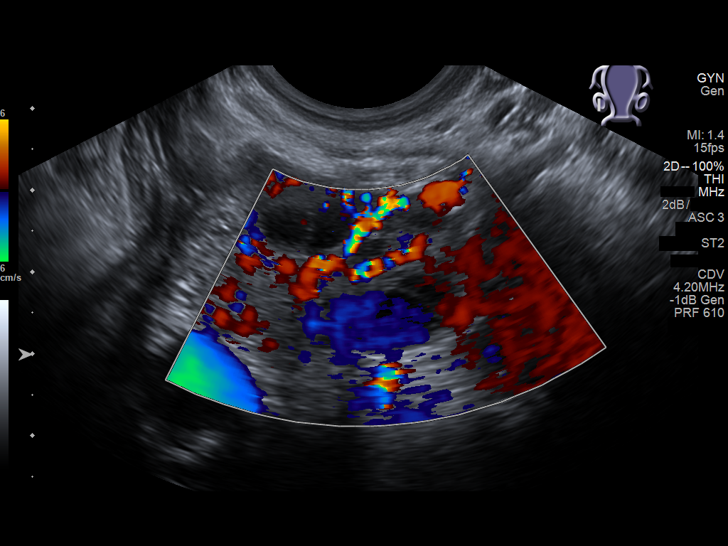
[im 91/100]
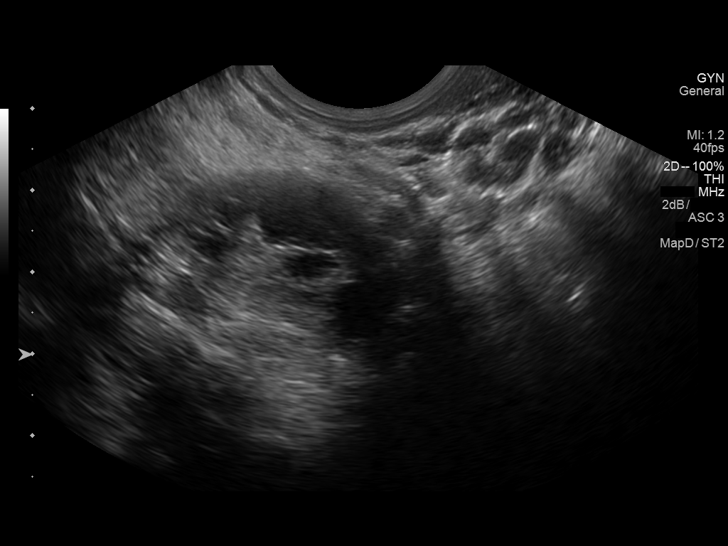
[im 100/100]
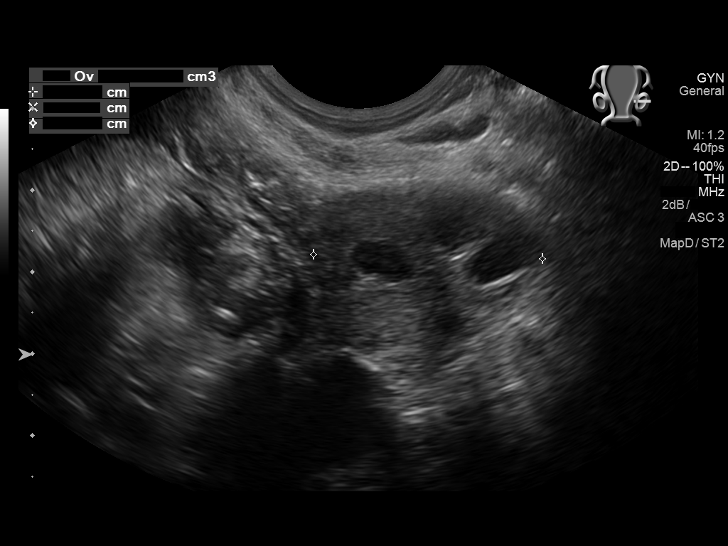

[13 of 25 positions shown; findings below may reference images not displayed]

FINDINGS: Uterus

Measurements: 7.1 x 2.7 x 3.5 cm. Anteverted uterus is normal in
size and configuration. No uterine fibroids or other myometrial
abnormalities.

Endometrium

Thickness: 4 mm. Intrauterine device is well positioned in the
uterine cavity, with no evidence of myometrial penetration by the
side arms of the device. Endometrial evaluation is limited by the
presence of the IUD. No endometrial cavity fluid or focal
endometrial mass.

Right ovary

Measurements: 3.3 x 2.3 x 2.2 cm (volume = 8.7 cm^3). Normal
appearance/no adnexal mass.

Left ovary

Measurements: 3.9 x 2.5 x 2.8 cm (volume = 14 cm^3). Left ovary is
mildly enlarged. At least 12 immature subcentimeter peripheral left
ovarian follicles. No abnormal left ovarian or left adnexal masses.

Other findings

No abnormal free fluid.
IMPRESSION: 1. Well-positioned IUD in the uterine cavity. Thin endometrium (4
mm). No evidence of focal endometrial abnormality. No uterine
fibroids.
2. Mildly enlarged left ovary with increased immature subcentimeter
left ovarian follicles. These findings would meet criteria for
polycystic ovarian syndrome in the correct clinical setting.

## 2019-03-28 NOTE — Anesthesia Post-op Follow-up Note (Signed)
  Anesthesia Pain Follow-up Note  Patient: Cindy Aguilar  Day #: 1  Date of Follow-up: 03/28/2019 Time: 8:12 AM  Last Vitals:  Vitals:   03/28/19 0700 03/28/19 0740  BP:  103/71  Pulse: 70 71  Resp:  18  Temp:  37.1 C  SpO2: 97% 100%    Level of Consciousness: alert  Pain: none   Side Effects:None  Catheter Site Exam:clean, dry, no drainage     Plan: D/C from anesthesia care at surgeon's request  Starling Manns

## 2019-03-28 NOTE — Progress Notes (Signed)
  Subjective:   Post Op Day 1: She is doing well, tolerating regular diet, tolerating pain with PO medications, ambulating and voiding without difficulty. Breastfeeding is going well.   Objective:  Blood pressure 108/66, pulse 76, temperature 98 F (36.7 C), temperature source Oral, resp. rate 18, height 5\' 3"  (1.6 m), weight 74.4 kg, last menstrual period 04/28/2018, SpO2 97 %, currently breastfeeding.  General: NAD Pulmonary: no increased work of breathing Abdomen: non-distended, non-tender, fundus firm at level of umbilicus Incision: area on left side of dressing with old sanguinous drainage, dressing is intact, On Q pump is intact Extremities: no edema, no erythema, no tenderness  Results for orders placed or performed during the hospital encounter of 03/27/19 (from the past 24 hour(s))  CBC     Status: Abnormal   Collection Time: 03/28/19  3:41 AM  Result Value Ref Range   WBC 15.5 (H) 4.0 - 10.5 K/uL   RBC 3.51 (L) 3.87 - 5.11 MIL/uL   Hemoglobin 9.1 (L) 12.0 - 15.0 g/dL   HCT 67.1 (L) 24.5 - 80.9 %   MCV 81.2 80.0 - 100.0 fL   MCH 25.9 (L) 26.0 - 34.0 pg   MCHC 31.9 30.0 - 36.0 g/dL   RDW 98.3 38.2 - 50.5 %   Platelets 251 150 - 400 K/uL   nRBC 0.0 0.0 - 0.2 %    Intake/Output Summary (Last 24 hours) at 03/28/2019 1316 Last data filed at 03/28/2019 3976 Gross per 24 hour  Intake 1990 ml  Output 2235 ml  Net -245 ml      Assessment:   23 y.o. [id number] postoperativeday # 1   Plan:  1) Acute blood loss anemia - hemodynamically stable and asymptomatic - po ferrous sulfate  2) B positive, Rubella Immune, Varicella Immune  3) TDAP status: given antepartum   4) Breastfeeding/Contraception: plans Mirena  5) Disposition: Continue Post C/Section care   Parke Poisson, CNM [facility] 03/28/2019, 1:15 PM

## 2019-03-28 NOTE — Anesthesia Postprocedure Evaluation (Signed)
Anesthesia Post Note  Patient: Cindy Aguilar  Procedure(s) Performed: CESAREAN SECTION (N/A )  Patient location during evaluation: Mother Baby Anesthesia Type: Spinal Level of consciousness: oriented and awake and alert Pain management: pain level controlled Vital Signs Assessment: post-procedure vital signs reviewed and stable Respiratory status: spontaneous breathing and respiratory function stable Cardiovascular status: blood pressure returned to baseline and stable Postop Assessment: no headache, no backache, no apparent nausea or vomiting and able to ambulate Anesthetic complications: no     Last Vitals:  Vitals:   03/28/19 0700 03/28/19 0740  BP:  103/71  Pulse: 70 71  Resp:  18  Temp:  37.1 C  SpO2: 97% 100%    Last Pain:  Vitals:   03/28/19 0740  TempSrc: Oral  PainSc:                  Starling Manns

## 2019-03-28 NOTE — Telephone Encounter (Signed)
FMLA/DISABILITY update filled out for [employer], signature obtained and given to KT for processing.

## 2019-03-29 IMAGING — US US EXTREM LOW VENOUS*L*
1 series · 13 of 24 positions shown · non-contrast
Comparison: None.

CLINICAL DATA: Left calf pain. Patient is currently 23 weeks
pregnant. Evaluate for DVT.



[Series 1: us extrem low venous*left* · 13 of 37 slices shown]
[im 1/37]
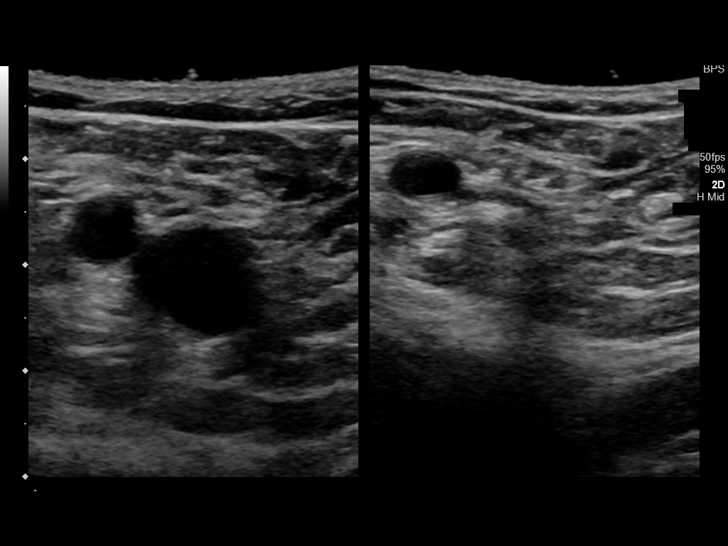
[im 4/37]
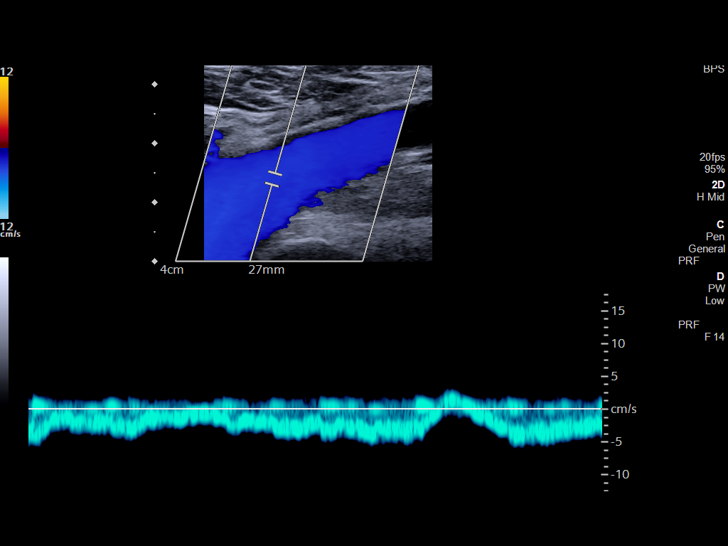
[im 7/37]
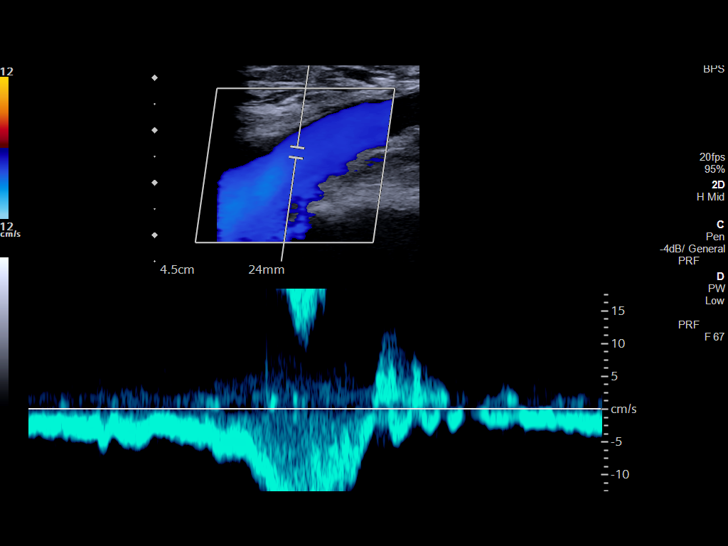
[im 10/37]
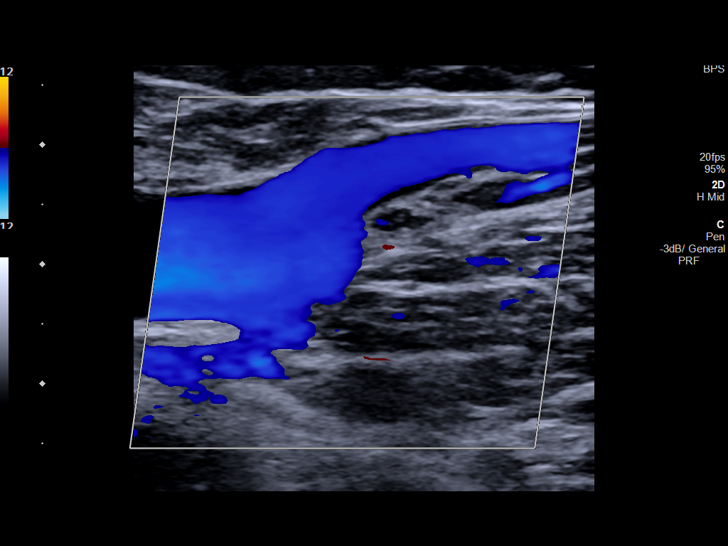
[im 13/37]
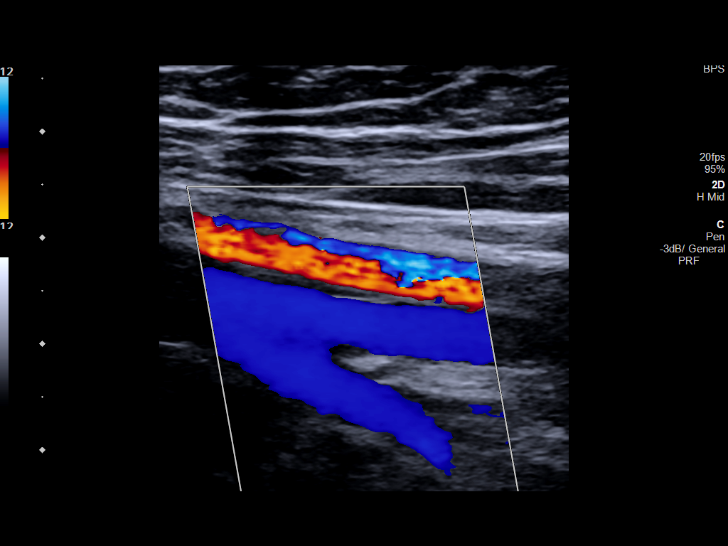
[im 16/37]
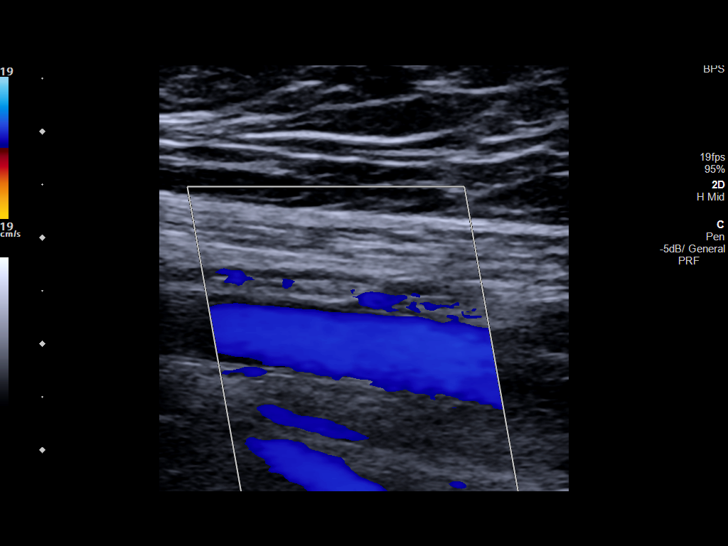
[im 19/37]
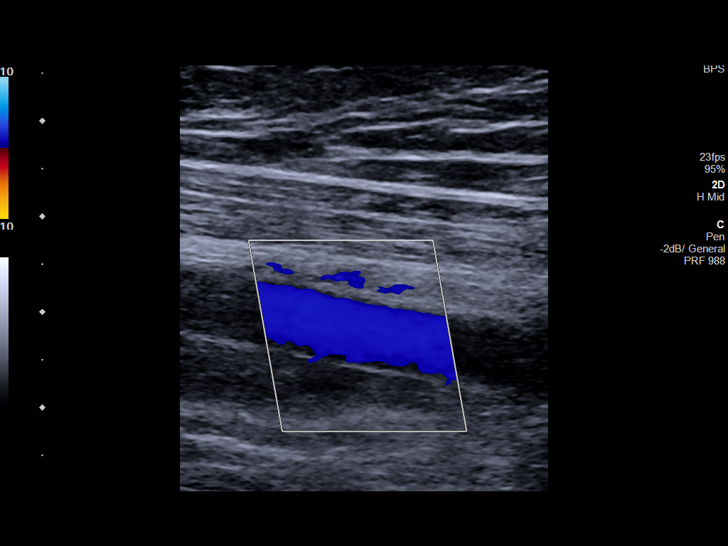
[im 21/37]
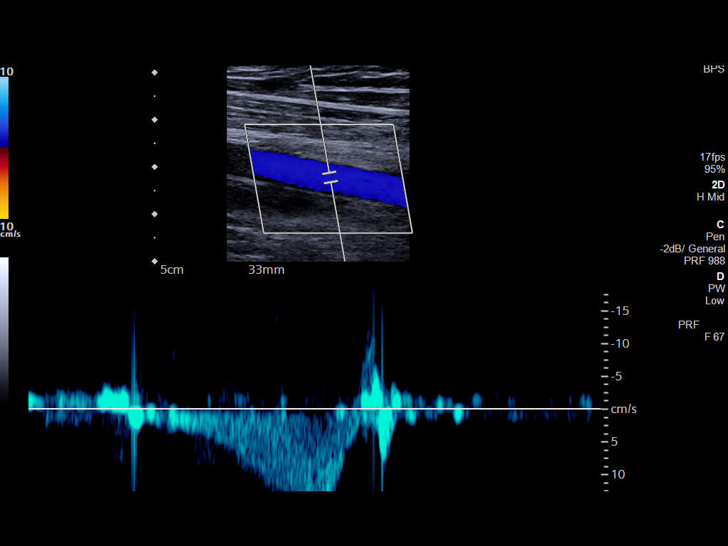
[im 24/37]
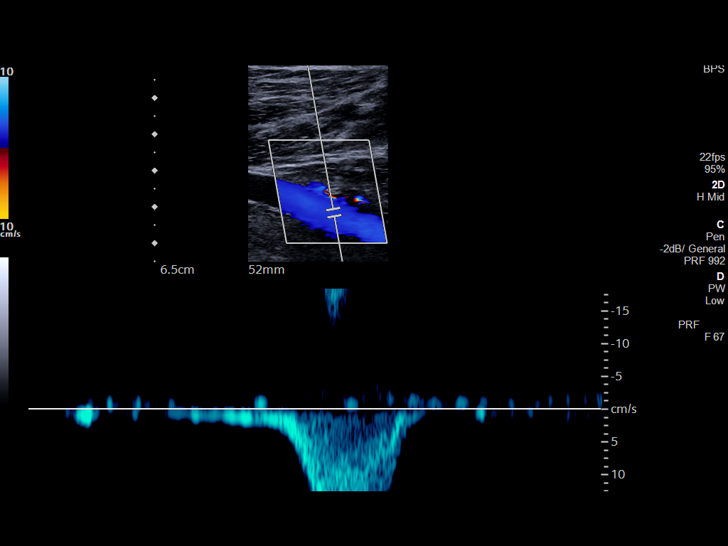
[im 27/37]
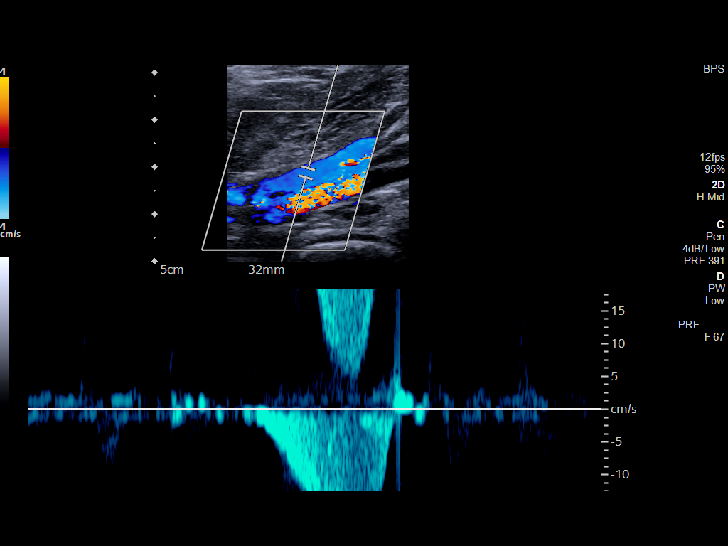
[im 30/37]
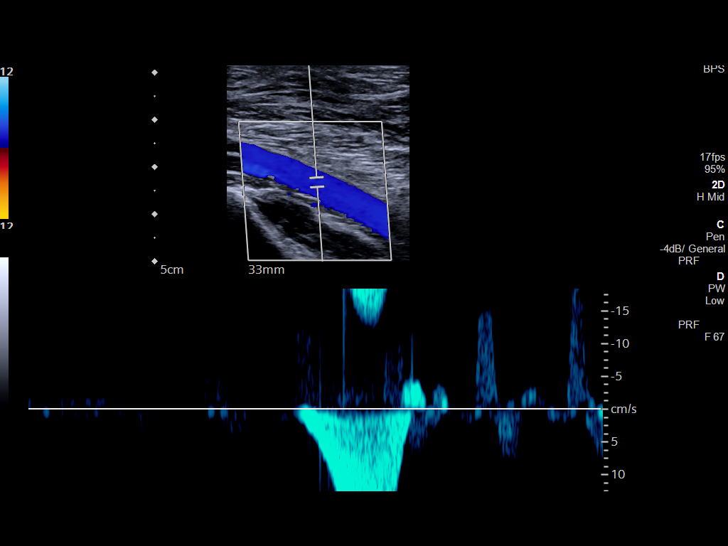
[im 33/37]
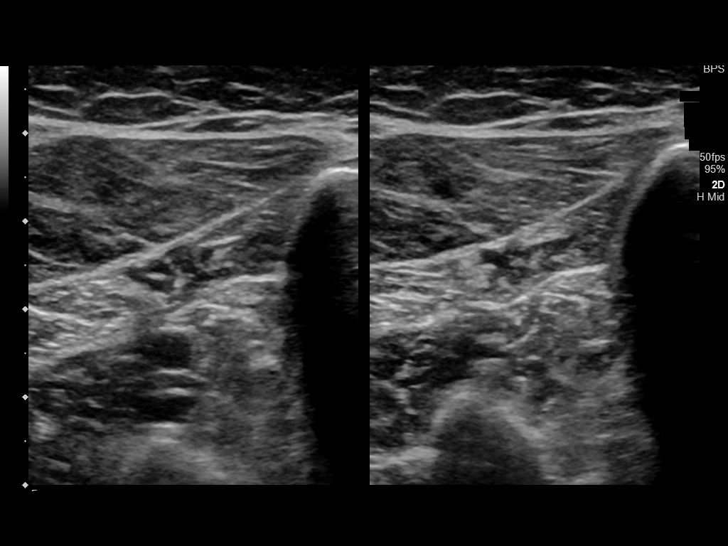
[im 37/37]
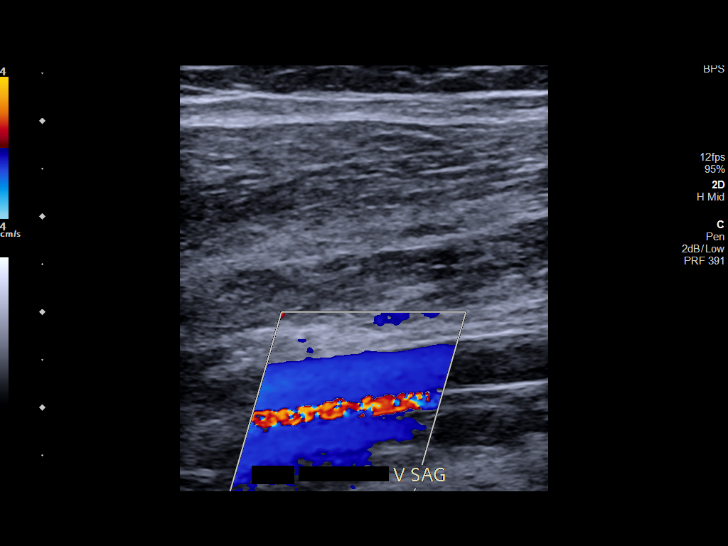

[13 of 24 positions shown; findings below may reference images not displayed]

FINDINGS: Contralateral Common Femoral Vein: Respiratory phasicity is normal
and symmetric with the symptomatic side. No evidence of thrombus.
Normal compressibility.

Common Femoral Vein: No evidence of thrombus. Normal
compressibility, respiratory phasicity and response to augmentation.

Saphenofemoral Junction: No evidence of thrombus. Normal
compressibility and flow on color Doppler imaging.

Profunda Femoral Vein: No evidence of thrombus. Normal
compressibility and flow on color Doppler imaging.

Femoral Vein: No evidence of thrombus. Normal compressibility,
respiratory phasicity and response to augmentation.

Popliteal Vein: No evidence of thrombus. Normal compressibility,
respiratory phasicity and response to augmentation.

Calf Veins: No evidence of thrombus. Normal compressibility and flow
on color Doppler imaging.

Superficial Great Saphenous Vein: No evidence of thrombus. Normal
compressibility.

Venous Reflux:  None.

Other Findings:  None.
IMPRESSION: No evidence of DVT within the left lower extremity.

## 2019-03-29 MED ORDER — FERROUS SULFATE 325 (65 FE) MG PO TABS: 325.0000 mg | ORAL_TABLET | Freq: Two times a day (BID) | ORAL | 0 refills | Status: DC

## 2019-03-29 MED ORDER — OXYCODONE-ACETAMINOPHEN 5-325 MG PO TABS: 1.0000 | ORAL_TABLET | ORAL | 0 refills | Status: DC | PRN

## 2019-03-29 NOTE — Progress Notes (Signed)
Patient discharged home with infant. Discharge instructions, prescriptions and follow up appointment given to and reviewed with patient. Patient verbalized understanding. Patient wheeled out with infant by auxiliary.  

## 2019-03-29 NOTE — Progress Notes (Signed)
Admit Date: 03/27/2019 Today's Date: 03/29/2019  Subjective: Postpartum Day 2: Cesarean Delivery Patient reports tolerating PO, + flatus and no problems voiding.    Objective: Vital signs in last 24 hours: Temp:  [97.4 F (36.3 C)-99.2 F (37.3 C)] 97.5 F (36.4 C) (04/16 0811) Pulse Rate:  [69-82] 74 (04/16 0811) Resp:  [16-20] 16 (04/16 0811) BP: (106-122)/(65-83) 106/83 (04/16 0811) SpO2:  [97 %-100 %] 100 % (04/16 0811)  Physical Exam:  General: alert, cooperative and no distress Lochia: appropriate Uterine Fundus: firm Incision: healing well, no significant drainage, no dehiscence, no significant erythema DVT Evaluation: No evidence of DVT seen on physical exam. Negative Homan's sign. No cords or calf tenderness. No significant calf/ankle edema.  Recent Labs    03/26/19 1104 03/28/19 0341  HGB 11.7* 9.1*  HCT 36.3 28.5*    Assessment/Plan: Status post Cesarean section. Doing well postoperatively.  Discharge home with standard precautions and return to clinic in 4-6 weeks.  Letitia Libra 03/29/2019, 10:59 AM

## 2019-04-04 ENCOUNTER — Ambulatory Visit (INDEPENDENT_AMBULATORY_CARE_PROVIDER_SITE_OTHER): Payer: Managed Care, Other (non HMO) | Admitting: Obstetrics and Gynecology

## 2019-04-04 ENCOUNTER — Encounter: Payer: Self-pay | Admitting: Obstetrics and Gynecology

## 2019-04-04 ENCOUNTER — Other Ambulatory Visit: Payer: Self-pay

## 2019-04-04 ENCOUNTER — Telehealth: Payer: Self-pay | Admitting: Obstetrics and Gynecology

## 2019-04-04 VITALS — BP 122/74 | Wt 151.0 lb

## 2019-04-04 DIAGNOSIS — Z09 Encounter for follow-up examination after completed treatment for conditions other than malignant neoplasm: Secondary | ICD-10-CM

## 2019-04-04 NOTE — Progress Notes (Signed)
   Postoperative Follow-up Patient presents post op from cesarean section  1 week ago.  Subjective: She denies fever, chills, nausea and vomiting. Eating a regular diet without difficulty. The patient is not having any pain.  Activity: increasing activity slowly. She denies issues with her incision.    Objective: BP 122/74   Wt 151 lb (68.5 kg)   BMI 26.75 kg/m   Constitutional: Well nourished, well developed female in no acute distress.  HEENT: normal Skin: Warm and dry.  Abdomen: s/NT/ND/+BS, uterus at U-6 clean, dry, intact and without erythema, induration, warmth, and tenderness Extremity: no edema   Assessment: 23 y.o. s/p cesarean section progressing well  Plan: Patient has done well after surgery with no apparent complications.  I have discussed the post-operative course to date, and the expected progress moving forward.  The patient understands what complications to be concerned about.    Activity plan: increase slowly Wound care instructions reviewed   Return in about 5 weeks (around 05/09/2019) for 6 week postpartum with Mirena IUD insertion.  Thomasene Mohair, MD 04/04/2019 11:04 AM

## 2019-04-04 NOTE — Telephone Encounter (Signed)
Pt coming in on 05/09/2019 at 9:50 with [facility]J for mirena insertion.

## 2019-04-05 ENCOUNTER — Other Ambulatory Visit: Payer: Self-pay | Admitting: Obstetrics & Gynecology

## 2019-04-19 ENCOUNTER — Other Ambulatory Visit: Payer: Self-pay | Admitting: Obstetrics & Gynecology

## 2019-04-19 MED ORDER — FERROUS SULFATE 325 (65 FE) MG PO TABS: 325.0000 mg | ORAL_TABLET | Freq: Two times a day (BID) | ORAL | 1 refills | Status: DC

## 2019-05-02 ENCOUNTER — Telehealth: Payer: Self-pay

## 2019-05-02 NOTE — Telephone Encounter (Signed)
FMLA/DISABILITY update for [employer] filled out, no signature needed, and given to KT for processing.

## 2019-05-05 DIAGNOSIS — Z09 Encounter for follow-up examination after completed treatment for conditions other than malignant neoplasm: Secondary | ICD-10-CM

## 2019-05-09 ENCOUNTER — Other Ambulatory Visit: Payer: Self-pay

## 2019-05-09 ENCOUNTER — Encounter: Payer: Self-pay | Admitting: Obstetrics and Gynecology

## 2019-05-09 ENCOUNTER — Ambulatory Visit (INDEPENDENT_AMBULATORY_CARE_PROVIDER_SITE_OTHER): Payer: Managed Care, Other (non HMO) | Admitting: Obstetrics and Gynecology

## 2019-05-09 DIAGNOSIS — Z30430 Encounter for insertion of intrauterine contraceptive device: Secondary | ICD-10-CM

## 2019-05-09 MED ORDER — LEVONORGESTREL 20 MCG/24HR IU IUD: 1.0000 | INTRAUTERINE_SYSTEM | Freq: Once | INTRAUTERINE | 0 refills | Status: DC

## 2019-05-09 NOTE — Progress Notes (Signed)
Postpartum Visit   Chief Complaint  Patient presents with   Postpartum Care   History of Present Illness: Patient is a 23 y.o. [id number] presents for postpartum visit.  Date of delivery: 03/27/2019 Type of delivery: C-Section  Episiotomy No.  Laceration: not applicable  Pregnancy or labor problems:  no Any problems since the delivery:  no  Newborn Details:  SINGLETON :  1. Baby's name: Hollyn. Birth weight: 8.11lb Maternal Details:  Breast Feeding:  YES Post partum depression/anxiety noted: no  [place] Post-Partum Depression Score:  3  Date of last PAP:  03/20/2018/NORMAL  Past Medical History:  Diagnosis Date   Asthma    well controlled   Depression    no meds   Iron deficiency anemia of pregnancy 2015   PCOS (polycystic ovarian syndrome)    LEFT OVARY ENLARGED    Past Surgical History:  Procedure Laterality Date   CESAREAN SECTION  2015   CESAREAN SECTION N/A 03/27/2019   Procedure: CESAREAN SECTION;  Surgeon: Conard Novak, MD;  Location: [facility] ORS;  Service: Obstetrics;  Laterality: N/A;    Prior to Admission medications   Medication Sig Start Date End Date Taking? Authorizing Provider  albuterol (PROVENTIL HFA;VENTOLIN HFA) 108 (90 Base) MCG/ACT inhaler Inhale 2 puffs into the lungs every 6 (six) hours as needed for wheezing or shortness of breath. Patient not taking: Reported on 05/09/2019 11/27/18   Jeanmarie Plant, MD  ferrous sulfate 325 (65 FE) MG tablet Take 1 tablet (325 mg total) by mouth 2 (two) times daily with a meal. Patient not taking: Reported on 05/09/2019 04/19/19   Nadara Mustard, MD  oxyCODONE-acetaminophen (PERCOCET/ROXICET) 5-325 MG tablet Take 1 tablet by mouth every 4 (four) hours as needed for moderate pain (pain score 4-7/10). Patient not taking: Reported on 05/09/2019 03/29/19   Nadara Mustard, MD  Prenatal Vit-Fe Fumarate-FA (PRENATAL MULTIVITAMIN) TABS tablet Take 1 tablet by mouth daily at 12 noon.    [provider]   Spacer/Aero-Holding Chambers (BREATHERITE COLL SPACER ADULT) MISC Use with your inhaler Patient not taking: Reported on 05/09/2019 11/27/18   Jeanmarie Plant, MD    No Known Allergies   Social History   Socioeconomic History   Marital status: Married    Spouse name: John   Number of children: 1   Years of education: 12   Highest education level: Not on file  Occupational History   Occupation: RECEPTIONIST    Comment: [employer]  Social Network engineer strain: Not hard at all   Food insecurity:    Worry: Never true    Inability: Never true   Transportation needs:    Medical: No    Non-medical: No  Tobacco Use   Smoking status: Never Smoker   Smokeless tobacco: Never Used  Substance and Sexual Activity   Alcohol use: No   Drug use: No   Sexual activity: Yes    Birth control/protection: I.U.D.  Lifestyle   Physical activity:    Days per week: 3 days    Minutes per session: 30 min   Stress: Not at all  Relationships   Social connections:    Talks on phone: More than three times a week    Gets together: More than three times a week    Attends religious service: More than 4 times per year    Active member of club or organization: No    Attends meetings of clubs or organizations: Never    Relationship  status: Married   Intimate partner violence:    Fear of current or ex partner: Not on file    Emotionally abused: Not on file    Physically abused: Not on file    Forced sexual activity: Not on file  Other Topics Concern   Not on file  Social History Narrative   Not on file    Family History  Problem Relation Age of Onset   Cancer Father 51       LEUKEMIA   Diabetes Maternal Uncle    Heart Problems Maternal Uncle        OPEN HEART SURGERY   Diabetes Maternal Uncle     Review of Systems  Constitutional: Negative.   HENT: Negative.   Eyes: Negative.   Respiratory: Negative.   Cardiovascular: Negative.     Gastrointestinal: Negative.   Genitourinary: Negative.   Musculoskeletal: Negative.   Skin: Negative.   Neurological: Negative.   Psychiatric/Behavioral: Negative.      Physical Exam BP 122/74    Ht 5\' 5"  (1.651 m)    Wt 145 lb (65.8 kg)    BMI 24.13 kg/m   Physical Exam Constitutional:      General: She is not in acute distress.    Appearance: Normal appearance. She is well-developed.  Genitourinary:     Pelvic exam was performed with patient in the lithotomy position.     Vulva, inguinal canal, urethra, bladder, vagina, uterus, right adnexa and left adnexa normal.     No posterior fourchette tenderness, injury or lesion present.     No cervical friability, lesion, bleeding or polyp.     Uterus is anteverted.  HENT:     Head: Normocephalic and atraumatic.  Eyes:     General: No scleral icterus.    Conjunctiva/sclera: Conjunctivae normal.  Neck:     Musculoskeletal: Normal range of motion and neck supple.  Cardiovascular:     Rate and Rhythm: Normal rate and regular rhythm.     Heart sounds: No murmur. No friction rub. No gallop.   Pulmonary:     Effort: Pulmonary effort is normal. No respiratory distress.     Breath sounds: Normal breath sounds. No wheezing or rales.  Abdominal:     General: Bowel sounds are normal. There is no distension.     Palpations: Abdomen is soft. There is no mass.     Tenderness: There is no abdominal tenderness. There is no guarding or rebound.     Comments: without erythema, induration, warmth, and tenderness. It is clean, dry, and intact.    Musculoskeletal: Normal range of motion.  Neurological:     General: No focal deficit present.     Mental Status: She is alert and oriented to person, place, and time.     Cranial Nerves: No cranial nerve deficit.  Skin:    General: Skin is warm and dry.     Findings: No erythema.  Psychiatric:        Mood and Affect: Mood normal.        Behavior: Behavior normal.        Judgment: Judgment normal.    IUD Insertion Procedure Note (Mirena) Patient identified, informed consent performed, consent signed.   Discussed risks of irregular bleeding, cramping, infection, malpositioning, expulsion or uterine perforation of the IUD (1:1000 placements)  which may require further procedure such as laparoscopy.  IUD while effective at preventing pregnancy do not prevent transmission of sexually transmitted diseases and use of barrier methods for  this purpose was discussed. Time out was performed.  Urine pregnancy test negative.  Speculum placed in the vagina.  Cervix visualized.  Cleaned with Betadine x 2.  Grasped anteriorly with a single tooth tenaculum.  Uterus sounded to 8.5 cm. IUD placed per manufacturer's recommendations.  Strings trimmed to 3 cm. Tenaculum was removed, good hemostasis noted.  Patient tolerated procedure well.   Patient was given post-procedure instructions.  She was advised to have backup contraception for one week.  Patient was also asked to check IUD strings periodically and follow up in 4 weeks for IUD check.    Female Chaperone present during breast and/or pelvic exam.  Assessment: 23 y.o. [id number] presenting for 6 week postpartum visit  Plan: Problem List Items Addressed This Visit    None    Visit Diagnoses    Postpartum care and examination    -  Primary   Relevant Medications   levonorgestrel (MIRENA) 20 MCG/24HR IUD   Encounter for IUD insertion       Relevant Medications   levonorgestrel (MIRENA) 20 MCG/24HR IUD     1) Contraception Education given regarding options for contraception, including IUD placement.  2)  Pap - ASCCP guidelines and rational discussed.  Patient opts for routine screening interval (not currently due)  3) Patient underwent screening for postpartum depression with no concerns noted.  Return in about 4 weeks (around 06/06/2019) for IUD String check.   Thomasene MohairStephen Eryck Negron, MD 05/09/2019 10:55 AM

## 2019-05-10 NOTE — Telephone Encounter (Signed)
Pt rcvd/charged Mirena & insertion 05/09/2019

## 2019-06-08 ENCOUNTER — Ambulatory Visit: Payer: Managed Care, Other (non HMO) | Admitting: Obstetrics and Gynecology

## 2019-06-08 ENCOUNTER — Telehealth: Payer: Self-pay

## 2019-06-08 NOTE — Telephone Encounter (Signed)
FMLA/DISABILITY form for [employer] filled out, signature obtained and given to KT for processing. 

## 2019-06-19 ENCOUNTER — Other Ambulatory Visit: Payer: Self-pay

## 2019-06-19 ENCOUNTER — Encounter: Payer: Self-pay | Admitting: Obstetrics and Gynecology

## 2019-06-19 ENCOUNTER — Ambulatory Visit (INDEPENDENT_AMBULATORY_CARE_PROVIDER_SITE_OTHER): Payer: Managed Care, Other (non HMO) | Admitting: Obstetrics and Gynecology

## 2019-06-19 VITALS — BP 108/64 | Ht 65.0 in | Wt 144.0 lb

## 2019-06-19 DIAGNOSIS — Z30431 Encounter for routine checking of intrauterine contraceptive device: Secondary | ICD-10-CM

## 2019-06-19 NOTE — Progress Notes (Signed)
IUD String Check  Subjctive: Cindy Aguilar presents for IUD string check.  She had a Mirena placed 6 weeks ago.  Since placement of her IUD she had some vaginal bleeding.  She denies cramping or discomfort.  She has had intercourse since placement.  She has not checked the strings.  She denies any fever, chills, nausea, vomiting, or other complaints.    Objective: BP 108/64   Ht 5\' 5"  (1.651 m)   Wt 144 lb (65.3 kg)   BMI 23.96 kg/m  Physical Exam Exam conducted with a chaperone present.  Constitutional:      General: She is not in acute distress.    Appearance: She is well-developed.  HENT:     Head: Normocephalic and atraumatic.  Eyes:     Conjunctiva/sclera: Conjunctivae normal.  Neck:     Musculoskeletal: Normal range of motion and neck supple.     Thyroid: No thyromegaly.  Cardiovascular:     Rate and Rhythm: Normal rate and regular rhythm.     Heart sounds: Normal heart sounds. No murmur. No friction rub. No gallop.   Pulmonary:     Effort: Pulmonary effort is normal.     Breath sounds: Normal breath sounds. No wheezing.  Abdominal:     General: There is no distension.     Palpations: Abdomen is soft. There is no mass.     Tenderness: There is no abdominal tenderness. There is no guarding or rebound.     Hernia: No hernia is present. There is no hernia in the left inguinal area.  Genitourinary:    General: Normal vulva.     Exam position: Lithotomy position.     Tanner stage (genital): 5.     Labia:        Right: No rash, tenderness or lesion.        Left: No rash, tenderness or lesion.      Vagina: Normal.     Cervix: Normal.     Uterus: Normal.      Adnexa: Right adnexa normal and left adnexa normal.     Comments: IUD String visualized and trimmed to 3 cm (was about 4cm) Musculoskeletal: Normal range of motion.  Skin:    General: Skin is warm and dry.     Findings: No rash.  Neurological:     Mental Status: She is alert and oriented to person,  place, and time.  Psychiatric:        Behavior: Behavior normal.    Female chaperone was present for the entirety of the pelvic exam  Assessment: 23 y.o. year old female status post prior Mirena IUD placement 6 week ago, doing well.  Plan: 1.  The patient was given instructions to check her IUD strings monthly and call with any problems or concerns.  She should call for fevers, chills, abnormal vaginal discharge, pelvic pain, or other complaints. 2.  She will return for a annual exam in 1 year.  All questions answered.  15 minutes spent in face to face discussion with > 50% spent in counseling, management, and coordination of care for her newly-placed IUD.  Risks and benefits of IUD discussed including the risks of irregular bleeding, cramping, infection, malpositioning, expulsion, which may require further procedures such as laparoscopy.  IUDs while effective at preventing pregnancy do not prevent transmission of sexually transmitted diseases and use of barrier methods for this purpose was discussed.  Low overall incidence of failure with 99.7% efficacy rate in typical use.  Thomasene MohairStephen Tymeka Privette, MD 06/19/2019 4:18 PM

## 2020-04-29 ENCOUNTER — Telehealth: Payer: Self-pay | Admitting: Obstetrics and Gynecology

## 2020-04-29 NOTE — Telephone Encounter (Signed)
Noted. Will order to arrive by apt date/time. 

## 2020-04-29 NOTE — Telephone Encounter (Signed)
Patient is schedule for 06/27/20 at 2:30 for annual , IUD removal and nexplanon placment with SDJ

## 2020-06-27 ENCOUNTER — Ambulatory Visit (INDEPENDENT_AMBULATORY_CARE_PROVIDER_SITE_OTHER): Payer: Managed Care, Other (non HMO) | Admitting: Obstetrics and Gynecology

## 2020-06-27 ENCOUNTER — Other Ambulatory Visit: Payer: Self-pay

## 2020-06-27 ENCOUNTER — Encounter: Payer: Self-pay | Admitting: Obstetrics and Gynecology

## 2020-06-27 ENCOUNTER — Other Ambulatory Visit (HOSPITAL_COMMUNITY)
Admission: RE | Admit: 2020-06-27 | Discharge: 2020-06-27 | Disposition: A | Discharge status: Home / Self Care | Payer: Managed Care, Other (non HMO) | Source: Ambulatory Visit | Attending: Obstetrics and Gynecology | Admitting: Obstetrics and Gynecology

## 2020-06-27 VITALS — BP 122/74 | Ht 63.0 in | Wt 138.0 lb

## 2020-06-27 DIAGNOSIS — Z1339 Encounter for screening examination for other mental health and behavioral disorders: Secondary | ICD-10-CM

## 2020-06-27 DIAGNOSIS — Z01419 Encounter for gynecological examination (general) (routine) without abnormal findings: Secondary | ICD-10-CM

## 2020-06-27 DIAGNOSIS — Z30017 Encounter for initial prescription of implantable subdermal contraceptive: Secondary | ICD-10-CM

## 2020-06-27 DIAGNOSIS — Z113 Encounter for screening for infections with a predominantly sexual mode of transmission: Secondary | ICD-10-CM

## 2020-06-27 DIAGNOSIS — Z1331 Encounter for screening for depression: Secondary | ICD-10-CM

## 2020-06-27 DIAGNOSIS — Z30432 Encounter for removal of intrauterine contraceptive device: Secondary | ICD-10-CM

## 2020-06-27 MED ORDER — NEXPLANON 68 MG ~~LOC~~ IMPL: 1.0000 | DRUG_IMPLANT | Freq: Once | SUBCUTANEOUS | 0 refills | Status: DC

## 2020-06-27 NOTE — Progress Notes (Signed)
Gynecology Annual Exam  PCP: Fayrene Helper, NP  Chief Complaint  Patient presents with  . Annual Exam   History of Present Illness:  Cindy Aguilar is a 24 y.o. [phone] who LMP was No LMP recorded. (Menstrual status: IUD)., presents today for her annual examination.  Her menses are absent with the IUD. However, she has pain on her right side. She has had an IUD before and had the same pain, which resolved once she took the IUD out.  She would like the Nexplanon instead.   She is sexually active. She has some pain on her right side after intercourse. .  Last Pap: 03/20/2018  Results were: no abnormalities /neg HPV DNA not done Hx of STDs: none  There is no FH of breast cancer. There is no FH of ovarian cancer. The patient does not do self-breast exams.  Tobacco use:  Does not smoke Alcohol use: none Exercise: some  The patient wears seatbelts: yes.   The patient reports that domestic violence in her life is absent.   Past Medical History:  Diagnosis Date  . Asthma    well controlled  . Depression    no meds  . Iron deficiency anemia of pregnancy 2015  . PCOS (polycystic ovarian syndrome)    LEFT OVARY ENLARGED   Past Surgical History:  Procedure Laterality Date  . CESAREAN SECTION  2015  . CESAREAN SECTION N/A 03/27/2019   Procedure: CESAREAN SECTION;  Surgeon: Conard Novak, MD;  Location: [facility] ORS;  Service: Obstetrics;  Laterality: N/A;    Prior to Admission medications   Medication Sig Start Date End Date Taking? Authorizing Provider  Prenatal Vit-Fe Fumarate-FA (PRENATAL MULTIVITAMIN) TABS tablet Take 1 tablet by mouth daily at 12 noon.   Yes [provider]  levonorgestrel (MIRENA) 20 MCG/24HR IUD 1 Intra Uterine Device (1 each total) by Intrauterine route once for 1 dose. 05/09/19 05/09/19  Conard Novak, MD   Allergies: No Known Allergies   Obstetric History: [id number]  Social History   Socioeconomic History  . Marital status: Married     Spouse name: Cindy Aguilar  . Number of children: 1  . Years of education: 57  . Highest education level: Not on file  Occupational History  . Occupation: RECEPTIONIST    Comment: [employer]  Tobacco Use  . Smoking status: Never Smoker  . Smokeless tobacco: Never Used  Vaping Use  . Vaping Use: Never used  Substance and Sexual Activity  . Alcohol use: No  . Drug use: No  . Sexual activity: Yes    Birth control/protection: I.U.D.  Other Topics Concern  . Not on file  Social History Narrative  . Not on file   Social Determinants of Health   Financial Resource Strain:   . Difficulty of Paying Living Expenses:   Food Insecurity:   . Worried About Programme researcher, broadcasting/film/video in the Last Year:   . Barista in the Last Year:   Transportation Needs:   . Freight forwarder (Medical):   Marland Kitchen Lack of Transportation (Non-Medical):   Physical Activity:   . Days of Exercise per Week:   . Minutes of Exercise per Session:   Stress:   . Feeling of Stress :   Social Connections:   . Frequency of Communication with Friends and Family:   . Frequency of Social Gatherings with Friends and Family:   . Attends Religious Services:   . Active Member of Clubs or  Organizations:   . Attends Banker Meetings:   Marland Kitchen Marital Status:   Intimate Partner Violence:   . Fear of Current or Ex-Partner:   . Emotionally Abused:   Marland Kitchen Physically Abused:   . Sexually Abused:     Family History  Problem Relation Age of Onset  . Cancer Father 43       LEUKEMIA  . Diabetes Maternal Uncle   . Heart Problems Maternal Uncle        OPEN HEART SURGERY  . Diabetes Maternal Uncle     Review of Systems  Constitutional: Negative.   HENT: Negative.   Eyes: Negative.   Respiratory: Negative.   Cardiovascular: Negative.   Gastrointestinal: Negative.   Genitourinary: Negative.   Musculoskeletal: Negative.   Skin: Negative.   Neurological: Negative.   Psychiatric/Behavioral: Negative.      Physical  Exam BP 122/74   Ht 5\' 3"  (1.6 m)   Wt 138 lb (62.6 kg)   BMI 24.45 kg/m    Physical Exam Constitutional:      General: She is not in acute distress.    Appearance: Normal appearance. She is well-developed.  Genitourinary:     Pelvic exam was performed with patient in the lithotomy position.     Vulva, urethra, bladder and uterus normal.     No inguinal adenopathy present in the right or left side.    No signs of injury in the vagina.     No vaginal discharge, erythema, tenderness or bleeding.     No cervical motion tenderness, discharge, lesion or polyp.     IUD strings visualized.     Uterus is mobile.     Uterus is not enlarged or tender.     No uterine mass detected.    Uterus is anteverted.     No right or left adnexal mass present.     Right adnexa not tender or full.     Left adnexa not tender or full.  HENT:     Head: Normocephalic and atraumatic.  Eyes:     General: No scleral icterus.    Conjunctiva/sclera: Conjunctivae normal.  Neck:     Thyroid: No thyromegaly.  Cardiovascular:     Rate and Rhythm: Normal rate and regular rhythm.     Heart sounds: No murmur heard.  No friction rub. No gallop.   Pulmonary:     Effort: Pulmonary effort is normal. No respiratory distress.     Breath sounds: Normal breath sounds. No wheezing or rales.  Chest:     Breasts:        Right: No inverted nipple, mass, nipple discharge, skin change or tenderness.        Left: No inverted nipple, mass, nipple discharge, skin change or tenderness.  Abdominal:     General: Bowel sounds are normal. There is no distension.     Palpations: Abdomen is soft. There is no mass.     Tenderness: There is no abdominal tenderness. There is no guarding or rebound.  Musculoskeletal:        General: No swelling or tenderness. Normal range of motion.     Cervical back: Normal range of motion and neck supple.  Lymphadenopathy:     Cervical: No cervical adenopathy.     Lower Body: No right inguinal  adenopathy. No left inguinal adenopathy.  Neurological:     General: No focal deficit present.     Mental Status: She is alert and oriented to person, place,  and time.     Cranial Nerves: No cranial nerve deficit.  Skin:    General: Skin is warm and dry.     Findings: No erythema or rash.  Psychiatric:        Mood and Affect: Mood normal.        Behavior: Behavior normal.        Judgment: Judgment normal.    IUD Removal  Patient identified, informed consent performed, consent signed.  Patient was in the dorsal lithotomy position, normal external genitalia was noted.  A speculum was placed in the patient's vagina, normal discharge was noted, no lesions. The cervix was visualized, no lesions, no abnormal discharge.  The strings of the IUD were grasped and pulled using ring forceps. The IUD was removed in its entirety.   Patient will use Nexplanon (see below) for contraception.   GYNECOLOGY PROCEDURE NOTE  Patient is a 24 y.o. [id number] presenting for Nexplanon insertion as her desires means of contraception.  She provided informed consent, signed copy in the chart, time out was performed. Pregnancy test was not done, with self reported LMP of No LMP recorded. (Menstrual status: IUD).  She understands that Nexplanon is a progesterone only therapy, and that patients often patients have irregular and unpredictable vaginal bleeding or amenorrhea. She understands that other side effects are possible related to systemic progesterone, including but not limited to, headaches, breast tenderness, nausea, and irritability. While effective at preventing pregnancy long acting reversible contraceptives do not prevent transmission of sexually transmitted diseases and use of barrier methods for this purpose was discussed. The placement procedure for Nexplanon was reviewed with the patient in detail including risks of nerve injury, infection, bleeding and injury to other muscles or tendons. She understands that  the Nexplanon implant is good for 3 years and needs to be removed at the end of that time.  She understands that Nexplanon is an extremely effective option for contraception, with failure rate of <1%. This information is reviewed today and all questions were answered. Informed consent was obtained, both verbally and written.   The patient is healthy and has no contraindications to Nexplanon use. Urine pregnancy test was performed today and was negative.  Procedure Appropriate time out taken.  Patient placed in dorsal supine with left arm above head, elbow flexed at 90 degrees, arm resting on examination table with hand behind her head.  The bicipital grove was palpated and site 8-10cm proximal to the medial epicondyle was indentified.  Per the manufacturer's recommendations, the insertion site was marked along a line 3-5 cm posterior (toward the triceps) to the bicipital groove and at 8-10 cm medial to the medial epicondyle. The insertion site was prepped with a two betadine swabs and then injected with 2 mL of 1% lidocaine without epinephrine.  Nexplanon removed form sterile blister packaging,  Device confirmed in needle, before inserting full length of needle, tenting up the skin as the needle was advance.  The drug eluting rod was then deployed by pulling back the slider per the manufactures recommendation.  The implant was palpable by the clinician as well as the patient.  The insertion site covered dressed with a 1/2" steri-strip before applying  a kerlex bandage pressure dressing..Minimal blood loss was noted during the procedure.  The patient tolerated the procedure well.   She was instructed to wear the bandage for 24 hours, call with any signs of infection.  She was given the Nexplanon card and instructed to have the rod removed in  3 years.  Female chaperone present for pelvic and breast  portions of the physical exam  Results: AUDIT Questionnaire (screen for alcoholism): 0 PHQ-9:  0   Assessment: 24 y.o. [id number] female here for routine annual gynecologic examination  Plan: Problem List Items Addressed This Visit    None    Visit Diagnoses    Women's annual routine gynecological examination    -  Primary   Relevant Medications   etonogestrel (NEXPLANON) 68 MG IMPL implant   Other Relevant Orders   Cervicovaginal ancillary only   Screening for depression       Screening for alcoholism       Screen for STD (sexually transmitted disease)       Relevant Orders   Cervicovaginal ancillary only   Encounter for IUD removal       Nexplanon insertion       Relevant Medications   etonogestrel (NEXPLANON) 68 MG IMPL implant      Screening: -- Blood pressure screen normal -- Weight screening: normal -- Depression screening negative (PHQ-9) -- Nutrition: normal -- cholesterol screening: not due for screening -- osteoporosis screening: not due -- tobacco screening: not using -- alcohol screening: AUDIT questionnaire indicates low-risk usage. -- family history of breast cancer screening: done. not at high risk. -- no evidence of domestic violence or intimate partner violence. -- STD screening: gonorrhea/chlamydia NAAT collected -- pap smear not collected per ASCCP guidelines  IUD removed without difficulty. Nexplanon placed without difficulty.   Thomasene MohairStephen Kelson Queenan, MD 06/27/2020 3:29 PM

## 2020-06-27 NOTE — Patient Instructions (Signed)
Nexplanon Instructions After Insertion  Keep bandage clean and dry for 24 hours  May use ice/Tylenol/Ibuprofen for soreness or pain  If you develop fever, drainage or increased warmth from incision site-contact office immediately   

## 2020-07-01 LAB — CERVICOVAGINAL ANCILLARY ONLY
Chlamydia: NEGATIVE
Comment: NEGATIVE
Comment: NEGATIVE
Comment: NORMAL
Neisseria Gonorrhea: NEGATIVE
Trichomonas: NEGATIVE

## 2021-05-10 ENCOUNTER — Encounter: Payer: Self-pay | Admitting: Emergency Medicine

## 2021-05-10 ENCOUNTER — Emergency Department (HOSPITAL_COMMUNITY): Payer: Managed Care, Other (non HMO)

## 2021-05-10 ENCOUNTER — Other Ambulatory Visit: Payer: Self-pay

## 2021-05-10 ENCOUNTER — Ambulatory Visit
Admission: EM | Admit: 2021-05-10 | Discharge: 2021-05-10 | Disposition: A | Discharge status: Home / Self Care | Payer: Managed Care, Other (non HMO) | Attending: Emergency Medicine | Admitting: Emergency Medicine

## 2021-05-10 ENCOUNTER — Emergency Department (HOSPITAL_COMMUNITY)
Admission: EM | Admit: 2021-05-10 | Discharge: 2021-05-10 | Disposition: A | Discharge status: Home / Self Care | Payer: Managed Care, Other (non HMO) | Attending: Emergency Medicine | Admitting: Emergency Medicine

## 2021-05-10 DIAGNOSIS — R1031 Right lower quadrant pain: Secondary | ICD-10-CM

## 2021-05-10 DIAGNOSIS — J45909 Unspecified asthma, uncomplicated: Secondary | ICD-10-CM | POA: Insufficient documentation

## 2021-05-10 DIAGNOSIS — Z20822 Contact with and (suspected) exposure to covid-19: Secondary | ICD-10-CM | POA: Diagnosis not present

## 2021-05-10 DIAGNOSIS — I880 Nonspecific mesenteric lymphadenitis: Secondary | ICD-10-CM | POA: Diagnosis not present

## 2021-05-10 DIAGNOSIS — R630 Anorexia: Secondary | ICD-10-CM | POA: Diagnosis not present

## 2021-05-10 DIAGNOSIS — R252 Cramp and spasm: Secondary | ICD-10-CM | POA: Insufficient documentation

## 2021-05-10 DIAGNOSIS — R509 Fever, unspecified: Secondary | ICD-10-CM

## 2021-05-10 LAB — CBC WITH DIFFERENTIAL/PLATELET
Abs Immature Granulocytes: 0.01 10*3/uL (ref 0.00–0.07)
Basophils Absolute: 0 10*3/uL (ref 0.0–0.1)
Basophils Relative: 0 %
Eosinophils Absolute: 0 10*3/uL (ref 0.0–0.5)
Eosinophils Relative: 0 %
HCT: 39.4 % (ref 36.0–46.0)
Hemoglobin: 13.5 g/dL (ref 12.0–15.0)
Immature Granulocytes: 0 %
Lymphocytes Relative: 16 %
Lymphs Abs: 0.7 10*3/uL (ref 0.7–4.0)
MCH: 30.4 pg (ref 26.0–34.0)
MCHC: 34.3 g/dL (ref 30.0–36.0)
MCV: 88.7 fL (ref 80.0–100.0)
Monocytes Absolute: 0.7 10*3/uL (ref 0.1–1.0)
Monocytes Relative: 16 %
Neutro Abs: 2.8 10*3/uL (ref 1.7–7.7)
Neutrophils Relative %: 68 %
Platelets: 196 10*3/uL (ref 150–400)
RBC: 4.44 MIL/uL (ref 3.87–5.11)
RDW: 11.5 % (ref 11.5–15.5)
WBC: 4.2 10*3/uL (ref 4.0–10.5)
nRBC: 0 % (ref 0.0–0.2)

## 2021-05-10 LAB — URINALYSIS, ROUTINE W REFLEX MICROSCOPIC
Bilirubin Urine: NEGATIVE
Glucose, UA: NEGATIVE mg/dL
Ketones, ur: 20 mg/dL — AB
Leukocytes,Ua: NEGATIVE
Nitrite: NEGATIVE
Protein, ur: NEGATIVE mg/dL
Specific Gravity, Urine: 1.002 — ABNORMAL LOW (ref 1.005–1.030)
pH: 6 (ref 5.0–8.0)

## 2021-05-10 LAB — POCT URINE PREGNANCY: Preg Test, Ur: NEGATIVE

## 2021-05-10 LAB — RESP PANEL BY RT-PCR (FLU A&B, COVID) ARPGX2
Influenza A by PCR: NEGATIVE
Influenza B by PCR: NEGATIVE
SARS Coronavirus 2 by RT PCR: NEGATIVE

## 2021-05-10 LAB — LACTIC ACID, PLASMA: Lactic Acid, Venous: 0.9 mmol/L (ref 0.5–1.9)

## 2021-05-10 LAB — COMPREHENSIVE METABOLIC PANEL
ALT: 17 U/L (ref 0–44)
AST: 18 U/L (ref 15–41)
Albumin: 4 g/dL (ref 3.5–5.0)
Alkaline Phosphatase: 52 U/L (ref 38–126)
Anion gap: 8 (ref 5–15)
BUN: 9 mg/dL (ref 6–20)
CO2: 21 mmol/L — ABNORMAL LOW (ref 22–32)
Calcium: 8.6 mg/dL — ABNORMAL LOW (ref 8.9–10.3)
Chloride: 104 mmol/L (ref 98–111)
Creatinine, Ser: 0.57 mg/dL (ref 0.44–1.00)
GFR, Estimated: >60 mL/min (ref >60)
Glucose, Bld: 95 mg/dL (ref 70–99)
Potassium: 3.6 mmol/L (ref 3.5–5.1)
Sodium: 133 mmol/L — ABNORMAL LOW (ref 135–145)
Total Bilirubin: 0.4 mg/dL (ref 0.3–1.2)
Total Protein: 7.2 g/dL (ref 6.5–8.1)

## 2021-05-10 LAB — I-STAT BETA HCG BLOOD, ED (MC, WL, AP ONLY): I-stat hCG, quantitative: <5 m[IU]/mL (ref <5)

## 2021-05-10 MED ORDER — ACETAMINOPHEN 325 MG PO TABS
650.0000 mg | ORAL_TABLET | Freq: Once | ORAL | Status: AC
  Administered 2021-05-10: 650 mg via ORAL

## 2021-05-10 MED ORDER — METRONIDAZOLE 500 MG/100ML IV SOLN
500.0000 mg | Freq: Once | INTRAVENOUS | Status: AC
  Administered 2021-05-10: 500 mg via INTRAVENOUS
  Filled 2021-05-10: qty 100

## 2021-05-10 MED ORDER — SODIUM CHLORIDE 0.9 % IV SOLN
2.0000 g | Freq: Once | INTRAVENOUS | Status: AC
  Administered 2021-05-10: 2 g via INTRAVENOUS
  Filled 2021-05-10: qty 20

## 2021-05-10 MED ORDER — IOHEXOL 9 MG/ML PO SOLN
ORAL | Status: AC
  Filled 2021-05-10: qty 1000

## 2021-05-10 MED ORDER — SODIUM CHLORIDE 0.9 % IV BOLUS
1000.0000 mL | Freq: Once | INTRAVENOUS | Status: AC
  Administered 2021-05-10: 1000 mL via INTRAVENOUS

## 2021-05-10 NOTE — ED Triage Notes (Signed)
Pt was sent from [facility] complaining of right sided  abdominal pain that started on Wednesday. Pain is on right side and radiate down legs. Denies trauma to region  [facility] to get [facility] for possible appendicitis  Pt was given tylenol at [facility] for fever 101F

## 2021-05-10 NOTE — ED Triage Notes (Signed)
Pt here for leg pain and RLQ pain; pt sts took pregnancy test and was possible faint positive; pt sts has nexplanon

## 2021-05-10 NOTE — ED Provider Notes (Signed)
[facility] EMERGENCY DEPARTMENT Provider Note   CSN: [id number] Arrival date & time: 05/10/21  5277     History Chief Complaint  Patient presents with  . Abdominal Pain    Cindy Aguilar is a 25 y.o. female.  Patient with history of PCOS, 2 previous cesarean sections presents the emergency department for evaluation of leg pain and right lower quadrant abdominal pain.  Patient states that on 5/25 she started having cramping in her legs.  This is not unusual for her to have prior to having a menstrual period.  The following day she felt a little bit worse and on 5/27 she states that she just wanted to lay on the couch.  She reported decreased appetite and began to have pain in the right lower quadrant of her abdomen that same day.  No associated vaginal discharge or bleeding.  No vomiting or diarrhea.  No dysuria, hematuria, or increased frequency or urgency.  Pain worsens prompting urgent care visit today patient was referred to the emergency department.  She took a home pregnancy test with a questionable "faint positive", however had a negative urine pregnancy test at urgent care (uses nexplanon). Pain is worse with movement and positioning.  She has been taking Tylenol at home and was given a dose at urgent care.  The onset of this condition was acute. The course is constant. Aggravating factors: none. Alleviating factors: none.  Fever to 101.69F today.           Past Medical History:  Diagnosis Date  . Asthma    well controlled  . Depression    no meds  . Iron deficiency anemia of pregnancy 2015  . PCOS (polycystic ovarian syndrome)    LEFT OVARY ENLARGED    Patient Active Problem List   Diagnosis Date Noted  . History of cesarean delivery 03/27/2019  . History of primary cesarean section 09/15/2018  . Supervision of high risk pregnancy, antepartum 08/04/2018    Past Surgical History:  Procedure Laterality Date  . CESAREAN SECTION  2015  . CESAREAN  SECTION N/A 03/27/2019   Procedure: CESAREAN SECTION;  Surgeon: Conard Novak, MD;  Location: [facility] ORS;  Service: Obstetrics;  Laterality: N/A;     OB History    Gravida  2   Para  2   Term  2   Preterm      AB      Living  2     SAB      IAB      Ectopic      Multiple  0   Live Births  2           Family History  Problem Relation Age of Onset  . Cancer Father 36       LEUKEMIA  . Diabetes Maternal Uncle   . Heart Problems Maternal Uncle        OPEN HEART SURGERY  . Diabetes Maternal Uncle     Social History   Tobacco Use  . Smoking status: Never Smoker  . Smokeless tobacco: Never Used  Vaping Use  . Vaping Use: Never used  Substance Use Topics  . Alcohol use: No  . Drug use: No    Home Medications Prior to Admission medications   Medication Sig Start Date End Date Taking? Authorizing Provider  etonogestrel (NEXPLANON) 68 MG IMPL implant 1 each (68 mg total) by Subdermal route once for 1 dose. 06/27/20 06/27/20  Conard Novak, MD  Prenatal Vit-Fe Fumarate-FA (PRENATAL MULTIVITAMIN) TABS tablet Take 1 tablet by mouth daily at 12 noon.    [provider]    Allergies    Patient has no known allergies.  Review of Systems   Review of Systems  Constitutional: Positive for appetite change and fatigue. Negative for fever.  HENT: Negative for rhinorrhea and sore throat.   Eyes: Negative for redness.  Respiratory: Negative for cough.   Cardiovascular: Negative for chest pain.  Gastrointestinal: Positive for abdominal pain and nausea. Negative for diarrhea and vomiting.  Genitourinary: Negative for dysuria, frequency, hematuria and urgency.  Musculoskeletal: Positive for myalgias.  Skin: Negative for rash.  Neurological: Negative for headaches.    Physical Exam Updated Vital Signs BP 120/75   Pulse 93   Temp (!) 101.2 F (38.4 C) (Oral)   Resp 18   SpO2 99%   Physical Exam Vitals and nursing note reviewed.   Constitutional:      General: She is not in acute distress.    Appearance: She is well-developed.  HENT:     Head: Normocephalic and atraumatic.     Right Ear: External ear normal.     Left Ear: External ear normal.     Nose: Nose normal.  Eyes:     Conjunctiva/sclera: Conjunctivae normal.  Cardiovascular:     Rate and Rhythm: Normal rate and regular rhythm.     Heart sounds: No murmur heard.   Pulmonary:     Effort: No respiratory distress.     Breath sounds: No wheezing, rhonchi or rales.  Abdominal:     Palpations: Abdomen is soft.     Tenderness: There is abdominal tenderness in the right lower quadrant. There is guarding. There is no rebound. Positive signs include Rovsing's sign, McBurney's sign, psoas sign and obturator sign. Negative signs include Murphy's sign.  Musculoskeletal:     Cervical back: Normal range of motion and neck supple.     Right lower leg: No edema.     Left lower leg: No edema.  Skin:    General: Skin is warm and dry.     Findings: No rash.  Neurological:     General: No focal deficit present.     Mental Status: She is alert. Mental status is at baseline.     Motor: No weakness.  Psychiatric:        Mood and Affect: Mood normal.     ED Results / Procedures / Treatments   Labs (all labs ordered are listed, but only abnormal results are displayed) Labs Reviewed  COMPREHENSIVE METABOLIC PANEL - Abnormal; Notable for the following components:      Result Value   Sodium 133 (*)    CO2 21 (*)    Calcium 8.6 (*)    All other components within normal limits  URINALYSIS, ROUTINE W REFLEX MICROSCOPIC - Abnormal; Notable for the following components:   Color, Urine STRAW (*)    APPearance HAZY (*)    Specific Gravity, Urine 1.002 (*)    Hgb urine dipstick LARGE (*)    Ketones, ur 20 (*)    Bacteria, UA RARE (*)    All other components within normal limits  RESP PANEL BY RT-PCR (FLU A&B, COVID) ARPGX2  CBC WITH DIFFERENTIAL/PLATELET  LACTIC  ACID, PLASMA  I-STAT BETA HCG BLOOD, ED ([facility], [facility], [facility] ONLY)    EKG None  Radiology CT ABDOMEN PELVIS WO CONTRAST  Result Date: 05/10/2021 CLINICAL DATA:  Right lower quadrant abdominal pain the  past 5 days. History of polycystic ovarian syndrome. Evaluate for appendicitis. EXAM: CT ABDOMEN AND PELVIS WITHOUT CONTRAST TECHNIQUE: Multidetector CT imaging of the abdomen and pelvis was performed following the standard protocol without IV contrast. COMPARISON:  None. FINDINGS: The lack of intravenous contrast limits the ability to evaluate solid abdominal organs. Lower chest: Limited visualization of the lower thorax is negative for focal airspace opacity or pleural effusion. Normal heart size. Trace amount of pericardial fluid, presumably physiologic. Hepatobiliary: Normal hepatic contour. Potential layering high-density material within the gallbladder could represent biliary sludge (representative image 24, series 3). No definitive gallbladder wall thickening or pericholecystic stranding on this noncontrast examination. No ascites. Pancreas: Normal noncontrast appearance of the pancreas. Spleen: Normal noncontrast appearance of the spleen. Adrenals/Urinary Tract: Normal noncontrast appearance of the bilateral kidneys. No renal stones. No renal stones are seen along the expected course of either ureter or the urinary bladder. Normal noncontrast appearance of the urinary bladder given degree of distention. No urinary obstruction or perinephric stranding. Normal noncontrast appearance of the bilateral adrenal glands. Stomach/Bowel: Ingested enteric contrast extends to the level of the rectum. Moderate colonic stool burden without evidence of enteric obstruction. No discrete areas of bowel wall thickening on this noncontrast examination. Normal noncontrast appearance of the terminal ileum and the retrocecal appendix (images 58 through 62, series 3). Vascular/Lymphatic: Normal caliber of the abdominal aorta.  Scattered mesenteric lymph nodes predominantly clustered within the right lower quadrant are mildly prominent though individually not enlarged by size criteria with index right lower abdominal mesenteric lymph node measuring 0.8 cm in greatest short axis diameter (coronal image 42, series 6), presumably reactive in etiology due to patient body habitus. No bulky retroperitoneal, mesenteric, pelvic or inguinal lymphadenopathy on this noncontrast examination. Reproductive: Note is made of an approximately 5.4 x 3.6 cm presumably physiologic left-sided adnexal cyst (image 66, series 3). No discrete right-sided adnexal lesions on this noncontrast examination. No free fluid within the pelvic cul-de-sac. Other: Tiny mesenteric fat containing periumbilical hernia. Minimal diastasis of midline abdominal musculature. Musculoskeletal: No acute or aggressive osseous abnormalities. IMPRESSION: 1. No explanation for patient's right lower quadrant abdominal pain. Specifically, no evidence of enteric or urinary obstruction. Normal noncontrast appearance of the appendix. 2. Potential biliary sludge within an otherwise normal appearing gallbladder. Clinical correlation is advised. Further evaluation with right upper quadrant abdominal ultrasound could be performed as indicated. 3. Note made of an approximately 5.4 cm left-sided adnexal lesion, incompletely characterized on this noncontrast examination - while presumably a physiologic adnexal cyst, especially given provided clinical history of polycystic ovarian syndrome, further evaluation with pelvic ultrasound could be performed as indicated. Electronically Signed   By: Simonne Come M.D.   On: 05/10/2021 13:24    Procedures Procedures   Medications Ordered in ED Medications  iohexol (OMNIPAQUE) 9 MG/ML oral solution (has no administration in time range)  cefTRIAXone (ROCEPHIN) 2 g in sodium chloride 0.9 % 100 mL IVPB (0 g Intravenous Stopped 05/10/21 1039)    And   metroNIDAZOLE (FLAGYL) IVPB 500 mg (500 mg Intravenous New Bag/Given 05/10/21 1113)  sodium chloride 0.9 % bolus 1,000 mL (0 mLs Intravenous Stopped 05/10/21 1105)    ED Course  I have reviewed the triage vital signs and the nursing notes.  Pertinent labs & imaging results that were available during my care of the patient were reviewed by me and considered in my medical decision making (see chart for details).  Patient seen and examined. Work-up initiated.  Patient appears nontoxic, however  given high clinical index of suspicion for appendicitis and fever, will initiate antibiotics.  She will need imaging after pregnancy test results.  Vital signs reviewed and are as follows: BP 120/75   Pulse 93   Temp (!) 101.2 F (38.4 C) (Oral)   Resp 18   SpO2 99%   9:46 AM Preg neg. WBC is actually normal. Will proceed with imaging.   2:21 PM CT imaging reviewed.  No signs of appendicitis fortunately.  Patient does have some gallbladder sludge with a normal appearing gallbladder otherwise, and a left ovarian cyst --that I feel are unlikely to be contributing to the patient's symptoms currently.  On reexam she appears comfortable.  We discussed her results.  There was note of predominant lymph nodes in the right lower quadrant.  Given low-grade fever, right lower quadrant abdominal pain, lymph nodes noted on CT --symptoms are most consistent with mesenteric adenitis.  Her lab work is supportive of this with normal white blood cell count.  UA was not convincing for infection.  Patient has defervesced with Tylenol and her vitals look normal.  She is comfortable with discharge to home.  I have asked her to follow-up with her doctor for recheck within the next 48 hours.  We discussed strict return precautions.  She will use Tylenol and or ibuprofen at home for pain control.  Discussed need for good fluid intake.  The patient was urged to return to the Emergency Department immediately with worsening of  current symptoms, worsening abdominal pain, persistent vomiting, blood noted in stools, fever, or any other concerns. The patient verbalized understanding.     MDM Rules/Calculators/A&P                          Patient with abdominal pain, RLQ, fever. Vitals are stable, no fever. Labs are reassuring. Imaging performed and does not demonstrate appendicitis. No signs of dehydration, patient is tolerating PO's. Lungs are clear and no signs suggestive of PNA.  Symptoms most concerning for mesenteric adenitis.  No definite indications for surgical consult or admission to the hospital today.  Low concern for appendicitis, cholecystitis, pancreatitis, ruptured viscus, UTI, kidney stone, aortic dissection, aortic aneurysm or other emergent abdominal etiology.  She is not pregnant and do not suspect tubal pregnancy.  Doubt ovarian torsion.  Supportive therapy indicated with return if symptoms worsen.   Final Clinical Impression(s) / ED Diagnoses Final diagnoses:  Right lower quadrant abdominal pain  Nonspecific mesenteric adenitis    Rx / DC Orders ED Discharge Orders    None       Renne Crigler, PA-C 05/10/21 1424    Horton, Clabe Seal, DO 05/11/21 1958

## 2021-05-10 NOTE — Discharge Instructions (Signed)
Please read and follow all provided instructions.  Your diagnoses today include:  1. Right lower quadrant abdominal pain   2. Nonspecific mesenteric adenitis     Tests performed today include:  Blood cell counts and platelets - normal white blood cell count  Kidney and liver function tests  Urine test to look for infection - no infection  A blood or urine test for pregnancy (women only) - was negative  CT scan - did not show a definite cause of your abdominal pain today however you did have a normal appendix.  You had some sludge in the gallbladder and a cyst on the left ovary, that I think are unlikely to be causing your pain.  There are some pronounced lymph nodes in the right lower quadrant which may represent mesenteric adenitis as we discussed.  Vital signs. See below for your results today.   Medications prescribed:  Please use over-the-counter NSAID medications (ibuprofen, naproxen) as directed on the packaging for pain if you do not have any reasons not to take these medications just as weak kidneys or a history of bleeding in your stomach or gut.   Take any prescribed medications only as directed.  Home care instructions:   Follow any educational materials contained in this packet.  Follow-up instructions: Please follow-up with your primary care provider in the next 2 days for further evaluation of your symptoms.    Return instructions:  SEEK IMMEDIATE MEDICAL ATTENTION IF:  The pain does not go away or becomes severe   A temperature above 101F develops   Repeated vomiting occurs (multiple episodes)   The pain becomes localized to portions of the abdomen. The right side could possibly be appendicitis. In an adult, the left lower portion of the abdomen could be colitis or diverticulitis.   Blood is being passed in stools or vomit (bright red or black tarry stools)   You develop chest pain, difficulty breathing, dizziness or fainting, or become confused, poorly  responsive, or inconsolable (young children)  If you have any other emergent concerns regarding your health  Additional Information: Abdominal (belly) pain can be caused by many things. Your caregiver performed an examination and possibly ordered blood/urine tests and imaging (CT scan, x-rays, ultrasound). Many cases can be observed and treated at home after initial evaluation in the emergency department. Even though you are being discharged home, abdominal pain can be unpredictable. Therefore, you need a repeated exam if your pain does not resolve, returns, or worsens. Most patients with abdominal pain don't have to be admitted to the hospital or have surgery, but serious problems like appendicitis and gallbladder attacks can start out as nonspecific pain. Many abdominal conditions cannot be diagnosed in one visit, so follow-up evaluations are very important.  Your vital signs today were: BP 108/67   Pulse 76   Temp (!) 101.2 F (38.4 C) (Oral)   Resp 18   SpO2 100%  If your blood pressure (bp) was elevated above 135/85 this visit, please have this repeated by your doctor within one month. --------------

## 2021-05-10 NOTE — ED Provider Notes (Signed)
[facility]    CSN: [id number] Arrival date & time: 05/10/21  0803      History   Chief Complaint Chief Complaint  Patient presents with  . Abdominal Pain    HPI Cindy Aguilar is a 25 y.o. female history of asthma presenting today for evaluation of abdominal pain and leg pain.  Reports pain developed in bilateral lower legs and right lower abdomen over the past 1 to 2 days.  Reports her pain in her legs is worse in her abdomen, but abdominal pain is still rated at 8 out of 10.  Pain worse with movement.  Reports possible faint positive pregnancy test at home.  Last menstrual cycle proximately 3 months ago, has Nexplanon in place.  HPI  Past Medical History:  Diagnosis Date  . Asthma    well controlled  . Depression    no meds  . Iron deficiency anemia of pregnancy 2015  . PCOS (polycystic ovarian syndrome)    LEFT OVARY ENLARGED    Patient Active Problem List   Diagnosis Date Noted  . History of cesarean delivery 03/27/2019  . History of primary cesarean section 09/15/2018  . Supervision of high risk pregnancy, antepartum 08/04/2018    Past Surgical History:  Procedure Laterality Date  . CESAREAN SECTION  2015  . CESAREAN SECTION N/A 03/27/2019   Procedure: CESAREAN SECTION;  Surgeon: Conard Novak, MD;  Location: [facility] ORS;  Service: Obstetrics;  Laterality: N/A;    OB History    Gravida  2   Para  2   Term  2   Preterm      AB      Living  2     SAB      IAB      Ectopic      Multiple  0   Live Births  2            Home Medications    Prior to Admission medications   Medication Sig Start Date End Date Taking? Authorizing Provider  etonogestrel (NEXPLANON) 68 MG IMPL implant 1 each (68 mg total) by Subdermal route once for 1 dose. 06/27/20 06/27/20  Conard Novak, MD  Prenatal Vit-Fe Fumarate-FA (PRENATAL MULTIVITAMIN) TABS tablet Take 1 tablet by mouth daily at 12 noon.    [provider]    Family  History Family History  Problem Relation Age of Onset  . Cancer Father 40       LEUKEMIA  . Diabetes Maternal Uncle   . Heart Problems Maternal Uncle        OPEN HEART SURGERY  . Diabetes Maternal Uncle     Social History Social History   Tobacco Use  . Smoking status: Never Smoker  . Smokeless tobacco: Never Used  Vaping Use  . Vaping Use: Never used  Substance Use Topics  . Alcohol use: No  . Drug use: No     Allergies   Patient has no known allergies.   Review of Systems Review of Systems  Constitutional: Negative for fatigue and fever.  Eyes: Negative for visual disturbance.  Respiratory: Negative for shortness of breath.   Cardiovascular: Negative for chest pain.  Gastrointestinal: Positive for abdominal pain. Negative for nausea and vomiting.  Musculoskeletal: Positive for gait problem and myalgias. Negative for arthralgias and joint swelling.  Skin: Negative for color change, rash and wound.  Neurological: Negative for dizziness, weakness, light-headedness and headaches.     Physical Exam Triage Vital Signs  ED Triage Vitals  Enc Vitals Group     BP      Pulse      Resp      Temp      Temp src      SpO2      Weight      Height      Head Circumference      Peak Flow      Pain Score      Pain Loc      Pain Edu?      Excl. in GC?    No data found.  Updated Vital Signs BP 120/75 (BP Location: Left Arm)   Pulse (!) 113   Temp (!) 101.4 F (38.6 C) (Oral)   Resp 18   SpO2 99%   Visual Acuity Right Eye Distance:   Left Eye Distance:   Bilateral Distance:    Right Eye Near:   Left Eye Near:    Bilateral Near:     Physical Exam Vitals and nursing note reviewed.  Constitutional:      Appearance: She is well-developed.     Comments: Appears uncomfortable with walking  HENT:     Head: Normocephalic and atraumatic.     Nose: Nose normal.  Eyes:     Conjunctiva/sclera: Conjunctivae normal.  Cardiovascular:     Rate and Rhythm: Normal  rate.  Pulmonary:     Effort: Pulmonary effort is normal. No respiratory distress.  Abdominal:     General: There is no distension.     Comments: Abdomen soft, nondistended, tender to palpation in bilateral lower quadrants, positive McBurney's, positive Rovsing  Musculoskeletal:        General: Normal range of motion.     Cervical back: Neck supple.     Comments: No obvious lower leg swelling or erythema  Skin:    General: Skin is warm and dry.  Neurological:     Mental Status: She is alert and oriented to person, place, and time.      UC Treatments / Results  Labs (all labs ordered are listed, but only abnormal results are displayed) Labs Reviewed  POCT URINE PREGNANCY    EKG   Radiology No results found.  Procedures Procedures (including critical care time)  Medications Ordered in UC Medications  acetaminophen (TYLENOL) tablet 650 mg (650 mg Oral Given 05/10/21 0826)    Initial Impression / Assessment and Plan / UC Course  I have reviewed the triage vital signs and the nursing notes.  Pertinent labs & imaging results that were available during my care of the patient were reviewed by me and considered in my medical decision making (see chart for details).     Right lower quadrant abdominal pain/leg pain-fever noted in clinic, pregnancy negative, recommending further evaluation in emergency room, constellations of symptoms warrants further blood work and imaging.  Patient expresses intent to go to emergency room.  Stable on discharge.  Discussed strict return precautions. Patient verbalized understanding and is agreeable with plan.  Final Clinical Impressions(s) / UC Diagnoses   Final diagnoses:  Fever, unspecified  Right lower quadrant abdominal pain   Discharge Instructions   None    ED Prescriptions    None     PDMP not reviewed this encounter.   Lew Dawes, [place] 05/10/21 [phone]

## 2021-09-09 IMAGING — CT CT ABD-PELV W/O CM
2 of 4 series · 15 of 46 positions shown, 17 images · non-contrast
Comparison: None.

CLINICAL DATA: Right lower quadrant abdominal pain the past 5 days.
History of polycystic ovarian syndrome. Evaluate for appendicitis.

EXAM:
CT ABDOMEN AND PELVIS WITHOUT CONTRAST
TECHNIQUE: Multidetector CT imaging of the abdomen and pelvis was performed
following the standard protocol without IV contrast.

[Series 3: a/p w/o 5mm · axial · non-contrast · 0.88mm/px · z∈[-589,-204]mm · 12 of 93 slices shown, 14 images]
[im 8/93  soft-tissue]
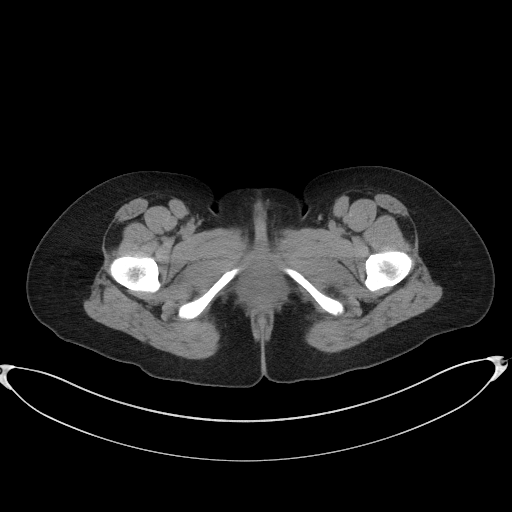
[im 8/93  bone]
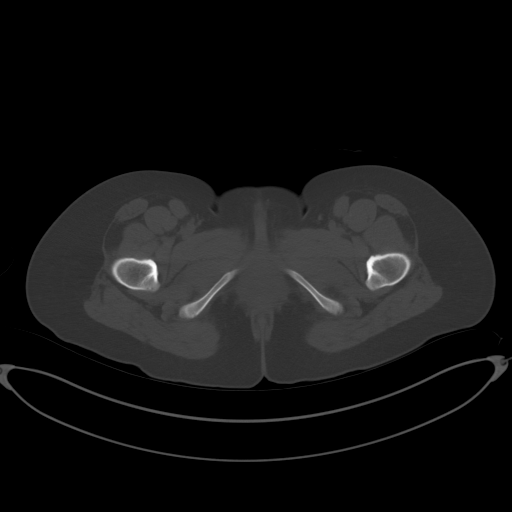
[im 15/93  soft-tissue]
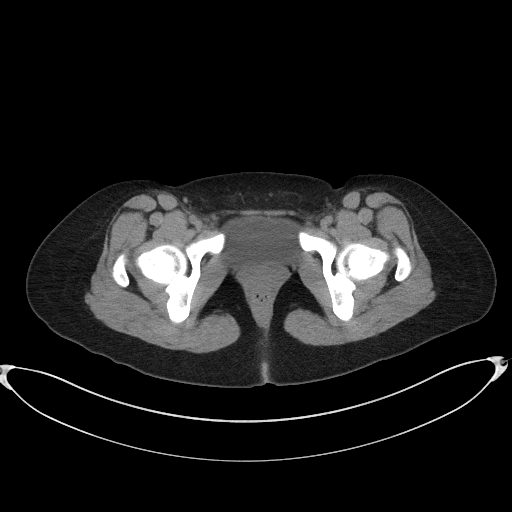
[im 22/93  soft-tissue]
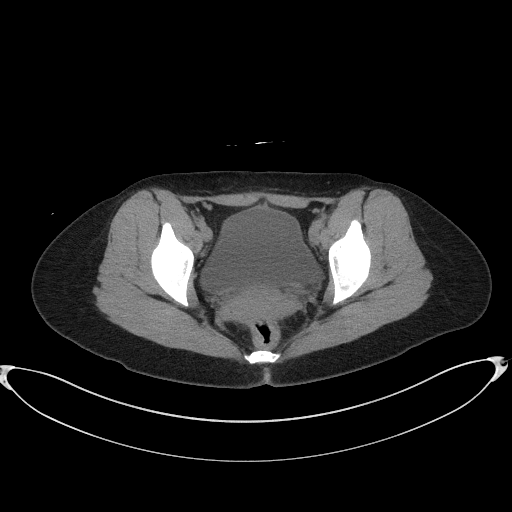
[im 29/93  soft-tissue]
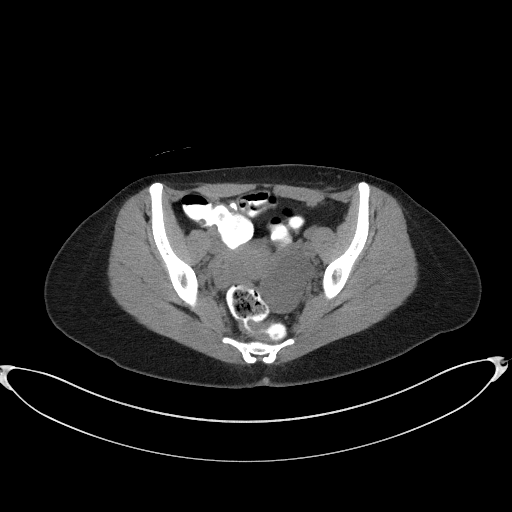
[im 36/93  soft-tissue]
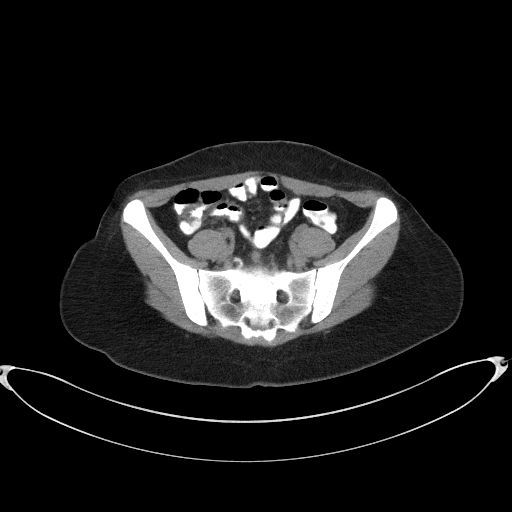
[im 43/93  soft-tissue]
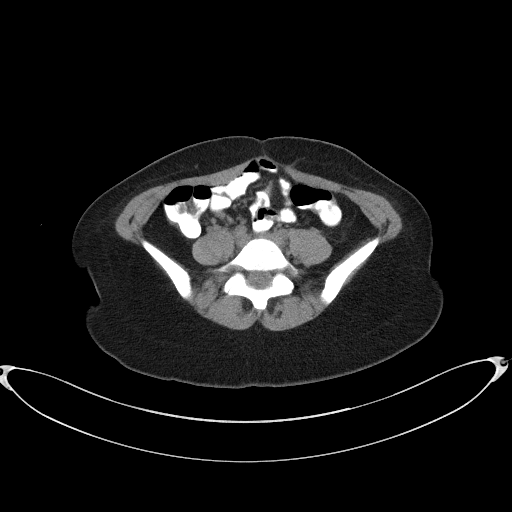
[im 50/93  soft-tissue]
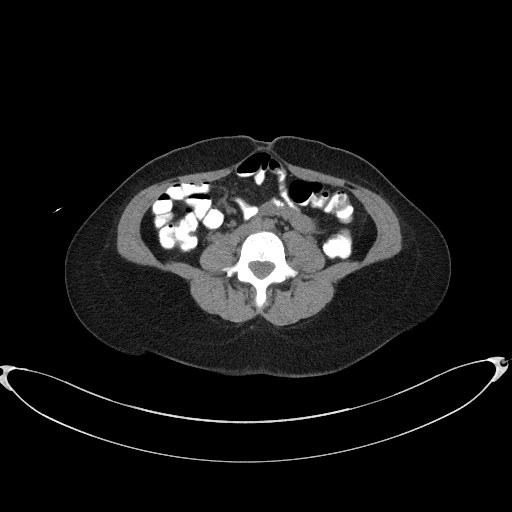
[im 57/93  soft-tissue]
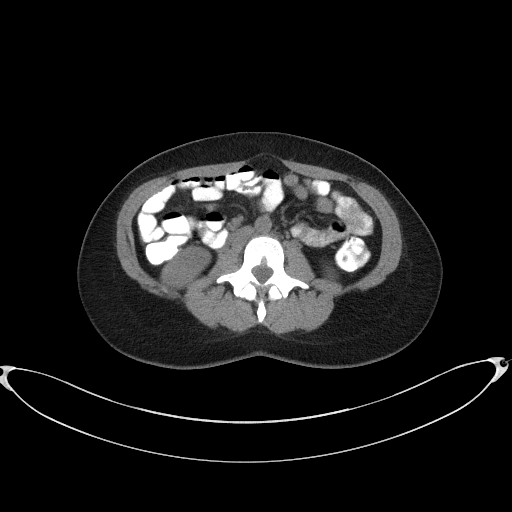
[im 64/93  soft-tissue]
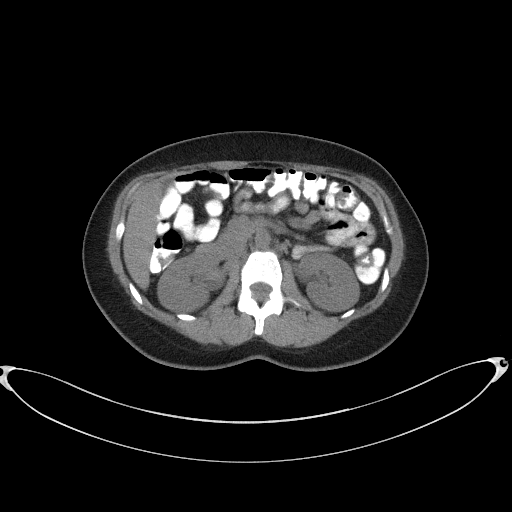
[im 64/93  bone]
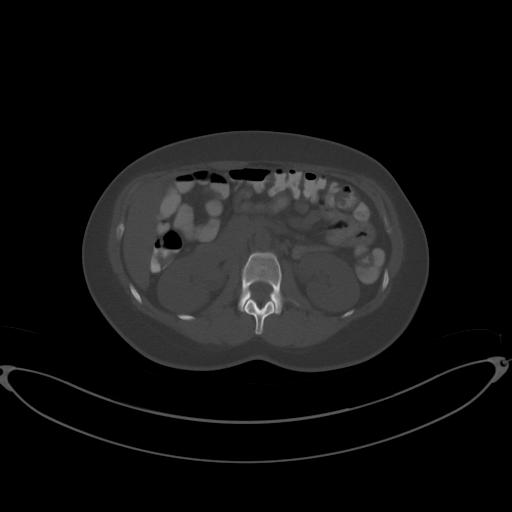
[im 71/93  soft-tissue]
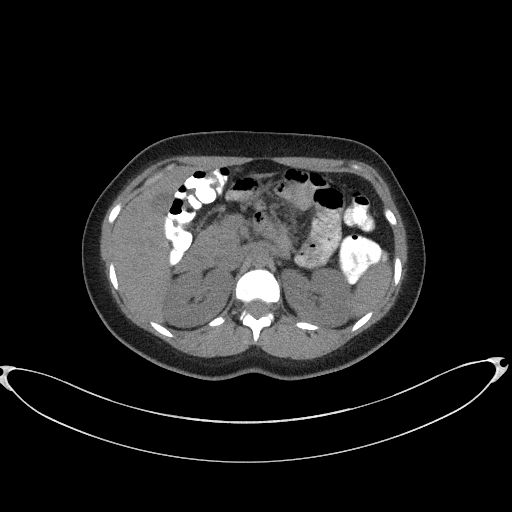
[im 78/93  soft-tissue]
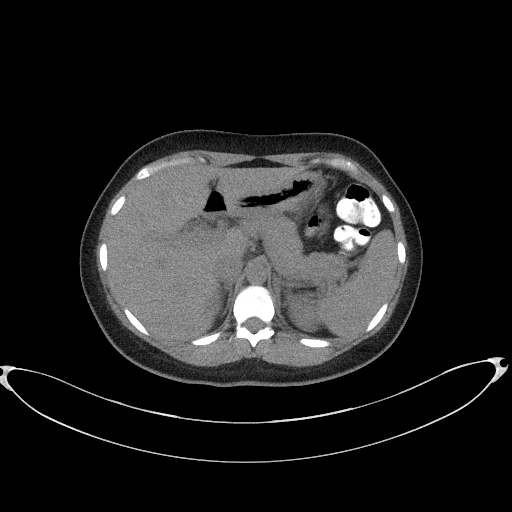
[im 85/93  soft-tissue]
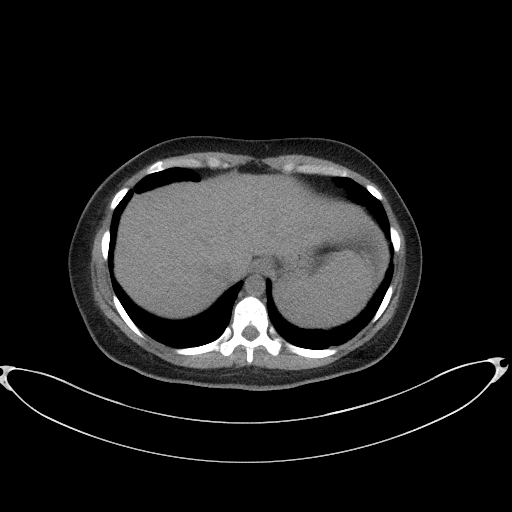

[Series 6: a/p w/o cor · coronal · non-contrast · 0.79mm/px · 3 of 111 slices shown]
[im 37/111  soft-tissue]
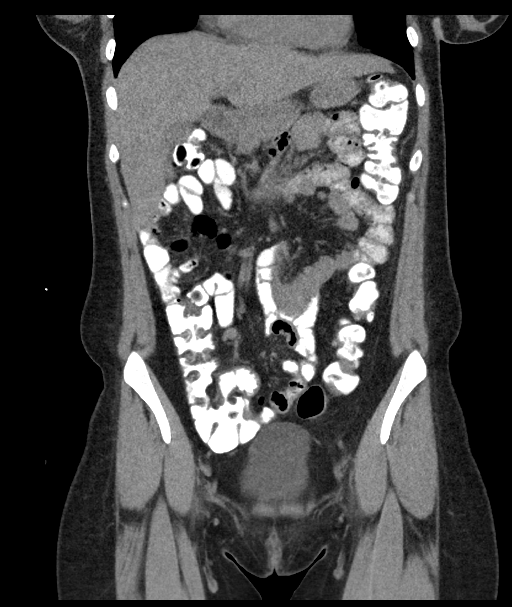
[im 49/111  soft-tissue]
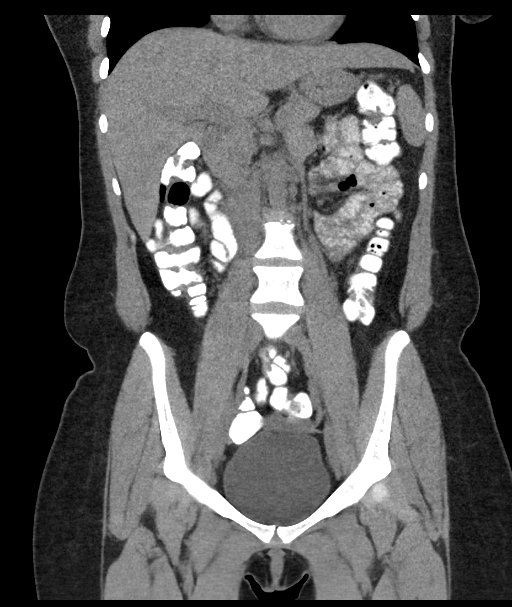
[im 62/111  soft-tissue]
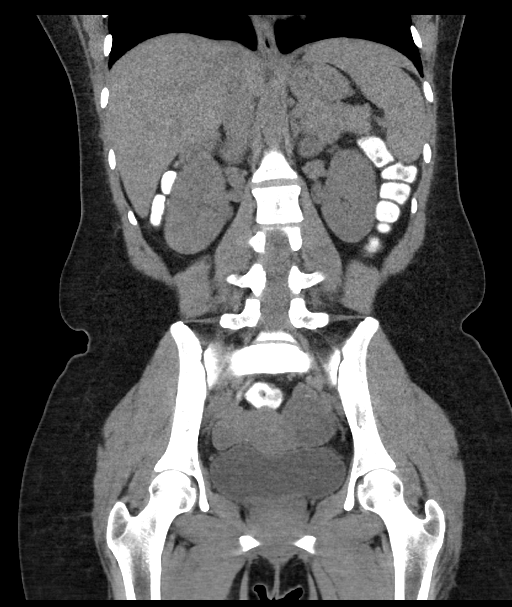

[15 of 46 positions shown; findings below may reference images not displayed]

FINDINGS: The lack of intravenous contrast limits the ability to evaluate
solid abdominal organs.

Lower chest: Limited visualization of the lower thorax is negative
for focal airspace opacity or pleural effusion.

Normal heart size. Trace amount of pericardial fluid, presumably
physiologic.

Hepatobiliary: Normal hepatic contour. Potential layering
high-density material within the gallbladder could represent biliary
sludge (representative image 24, series 3). No definitive
gallbladder wall thickening or pericholecystic stranding on this
noncontrast examination. No ascites.

Pancreas: Normal noncontrast appearance of the pancreas.

Spleen: Normal noncontrast appearance of the spleen.

Adrenals/Urinary Tract: Normal noncontrast appearance of the
bilateral kidneys. No renal stones. No renal stones are seen along
the expected course of either ureter or the urinary bladder. Normal
noncontrast appearance of the urinary bladder given degree of
distention. No urinary obstruction or perinephric stranding.

Normal noncontrast appearance of the bilateral adrenal glands.

Stomach/Bowel: Ingested enteric contrast extends to the level of the
rectum. Moderate colonic stool burden without evidence of enteric
obstruction. No discrete areas of bowel wall thickening on this
noncontrast examination. Normal noncontrast appearance of the
terminal ileum and the retrocecal appendix (images 58 through 62,
series 3).

Vascular/Lymphatic: Normal caliber of the abdominal aorta.

Scattered mesenteric lymph nodes predominantly clustered within the
right lower quadrant are mildly prominent though individually not
enlarged by size criteria with index right lower abdominal
mesenteric lymph node measuring 0.8 cm in greatest short axis
diameter (coronal image 42, series 6), presumably reactive in
etiology due to patient body habitus. No bulky retroperitoneal,
mesenteric, pelvic or inguinal lymphadenopathy on this noncontrast
examination.

Reproductive: Note is made of an approximately 5.4 x 3.6 cm
presumably physiologic left-sided adnexal cyst (image 66, series 3).
No discrete right-sided adnexal lesions on this noncontrast
examination. No free fluid within the pelvic cul-de-sac.

Other: Tiny mesenteric fat containing periumbilical hernia. Minimal
diastasis of midline abdominal musculature.

Musculoskeletal: No acute or aggressive osseous abnormalities.
IMPRESSION: 1. No explanation for patient's right lower quadrant abdominal pain.
Specifically, no evidence of enteric or urinary obstruction. Normal
noncontrast appearance of the appendix.
2. Potential biliary sludge within an otherwise normal appearing
gallbladder. Clinical correlation is advised. Further evaluation
with right upper quadrant abdominal ultrasound could be performed as
indicated.
3. Note made of an approximately 5.4 cm left-sided adnexal lesion,
incompletely characterized on this noncontrast examination - while
presumably a physiologic adnexal cyst, especially given provided
clinical history of polycystic ovarian syndrome, further evaluation
with pelvic ultrasound could be performed as indicated.

## 2021-10-06 NOTE — Telephone Encounter (Signed)
Nexplanon rcvd/charged 06/27/2020

## 2021-11-13 ENCOUNTER — Encounter: Payer: Self-pay | Admitting: Obstetrics and Gynecology

## 2021-11-13 ENCOUNTER — Other Ambulatory Visit (HOSPITAL_COMMUNITY)
Admission: RE | Admit: 2021-11-13 | Discharge: 2021-11-13 | Disposition: A | Discharge status: Home / Self Care | Payer: Managed Care, Other (non HMO) | Source: Ambulatory Visit | Attending: Obstetrics and Gynecology | Admitting: Obstetrics and Gynecology

## 2021-11-13 ENCOUNTER — Other Ambulatory Visit: Payer: Self-pay

## 2021-11-13 ENCOUNTER — Ambulatory Visit (INDEPENDENT_AMBULATORY_CARE_PROVIDER_SITE_OTHER): Payer: Managed Care, Other (non HMO) | Admitting: Obstetrics and Gynecology

## 2021-11-13 VITALS — BP 118/74 | Ht 63.0 in | Wt 150.0 lb

## 2021-11-13 DIAGNOSIS — Z01419 Encounter for gynecological examination (general) (routine) without abnormal findings: Secondary | ICD-10-CM | POA: Insufficient documentation

## 2021-11-13 DIAGNOSIS — Z1339 Encounter for screening examination for other mental health and behavioral disorders: Secondary | ICD-10-CM

## 2021-11-13 DIAGNOSIS — Z124 Encounter for screening for malignant neoplasm of cervix: Secondary | ICD-10-CM

## 2021-11-13 DIAGNOSIS — Z1331 Encounter for screening for depression: Secondary | ICD-10-CM

## 2021-11-13 DIAGNOSIS — Z113 Encounter for screening for infections with a predominantly sexual mode of transmission: Secondary | ICD-10-CM | POA: Insufficient documentation
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
## 2021-11-18 LAB — CYTOLOGY - PAP
Chlamydia: NEGATIVE
Comment: NEGATIVE
Comment: NORMAL
Neisseria Gonorrhea: NEGATIVE

## 2021-11-19 ENCOUNTER — Telehealth: Payer: Self-pay

## 2021-11-19 NOTE — Telephone Encounter (Signed)
Pt calling regarding her pap smear results. Abnormal pap, advised to send to you since you are MD on call. Can you contact pt?

## 2021-11-25 ENCOUNTER — Encounter: Payer: Self-pay | Admitting: Obstetrics and Gynecology

## 2021-11-25 NOTE — Telephone Encounter (Signed)
Colposcopy sometime next 4 week any MD

## 2021-11-25 NOTE — Telephone Encounter (Signed)
Patient is scheduled for 12/21/21 with AMS

## 2021-12-21 ENCOUNTER — Ambulatory Visit: Payer: Managed Care, Other (non HMO) | Admitting: Obstetrics and Gynecology

## 2022-01-25 ENCOUNTER — Ambulatory Visit: Payer: Managed Care, Other (non HMO) | Admitting: Obstetrics & Gynecology

## 2023-06-21 ENCOUNTER — Ambulatory Visit (INDEPENDENT_AMBULATORY_CARE_PROVIDER_SITE_OTHER): Payer: Managed Care, Other (non HMO) | Admitting: Certified Nurse Midwife

## 2023-06-21 ENCOUNTER — Other Ambulatory Visit (HOSPITAL_COMMUNITY)
Admission: RE | Admit: 2023-06-21 | Discharge: 2023-06-21 | Disposition: A | Discharge status: Home / Self Care | Payer: Managed Care, Other (non HMO) | Source: Ambulatory Visit | Attending: Certified Nurse Midwife | Admitting: Certified Nurse Midwife

## 2023-06-21 ENCOUNTER — Encounter: Payer: Self-pay | Admitting: Certified Nurse Midwife

## 2023-06-21 VITALS — BP 108/76 | HR 74 | Wt 151.1 lb

## 2023-06-21 DIAGNOSIS — Z3A12 12 weeks gestation of pregnancy: Secondary | ICD-10-CM | POA: Insufficient documentation

## 2023-06-21 DIAGNOSIS — Z3481 Encounter for supervision of other normal pregnancy, first trimester: Secondary | ICD-10-CM | POA: Insufficient documentation

## 2023-06-21 DIAGNOSIS — Z124 Encounter for screening for malignant neoplasm of cervix: Secondary | ICD-10-CM

## 2023-06-21 DIAGNOSIS — Z113 Encounter for screening for infections with a predominantly sexual mode of transmission: Secondary | ICD-10-CM

## 2023-06-21 LAB — POCT URINALYSIS DIPSTICK OB
Bilirubin, UA: NEGATIVE
Blood, UA: NEGATIVE
Glucose, UA: NEGATIVE
Leukocytes, UA: NEGATIVE
Nitrite, UA: NEGATIVE
POC,PROTEIN,UA: NEGATIVE
Spec Grav, UA: 1.025 (ref 1.010–1.025)
Urobilinogen, UA: 0.2 E.U./dL
pH, UA: 5 (ref 5.0–8.0)

## 2023-06-21 MED ORDER — ONDANSETRON 4 MG PO TBDP: 4.0000 mg | ORAL_TABLET | Freq: Three times a day (TID) | ORAL | 1 refills | Status: DC | PRN

## 2023-06-21 NOTE — Progress Notes (Addendum)
NEW OB HISTORY AND PHYSICAL  SUBJECTIVE:       Cindy Aguilar is a 27 y.o. G49P2002 female, No LMP recorded. Patient is pregnant., Estimated Date of Delivery: 12/30/23, [redacted]w[redacted]d, presents today for establishment of Prenatal Care. She complains of nausea, zofran ordered  Body mass index is 26.77 kg/m.  Relationship: married x 10 yrs Living with husband and 2 girls  Work : IT at [employer] Exercise walking daily Alcohol -denies Drug-denies Smoking/vaping-denies     Gynecologic History No LMP recorded. Patient is pregnant. Normal Contraception: none Last Pap: 11/13/2021. Results were: LSIL Colposcopy: 02/23/2019   Obstetric History OB History  Gravida Para Term Preterm AB Living  3 2 2     2   SAB IAB Ectopic Multiple Live Births        0 2    # Outcome Date GA Lbr Len/2nd Weight Sex Delivery Anes PTL Lv  3 Current           2 Term 03/27/19 [redacted]w[redacted]d  8 lb 11 oz (3.94 kg) F CS-LTranv Spinal  LIV  1 Term 07/21/14 [redacted]w[redacted]d  7 lb 8 oz (3.402 kg) F CS-LTranv   LIV     Complications: Failure to Progress in First Stage, Chorioamnionitis    Past Medical History:  Diagnosis Date   Asthma    well controlled   Depression    no meds   Iron deficiency anemia of pregnancy 2015   PCOS (polycystic ovarian syndrome)    LEFT OVARY ENLARGED    Past Surgical History:  Procedure Laterality Date   CESAREAN SECTION  2015   CESAREAN SECTION N/A 03/27/2019   Procedure: CESAREAN SECTION;  Surgeon: Conard Novak, MD;  Location: [facility] ORS;  Service: Obstetrics;  Laterality: N/A;    Current Outpatient Medications on File Prior to Visit  Medication Sig Dispense Refill   Ferrous Sulfate (IRON PO) Take 1 tablet by mouth daily.     albuterol (VENTOLIN HFA) 108 (90 Base) MCG/ACT inhaler  (Patient not taking: Reported on 06/21/2023)     fluticasone (FLONASE) 50 MCG/ACT nasal spray  (Patient not taking: Reported on 06/21/2023)     No current facility-administered medications on file prior to visit.     No Known Allergies  Social History   Socioeconomic History   Marital status: Married    Spouse name: John   Number of children: 1   Years of education: 12   Highest education level: Not on file  Occupational History   Occupation: RECEPTIONIST    Comment: [employer] OFFICE  Tobacco Use   Smoking status: Never   Smokeless tobacco: Never  Vaping Use   Vaping Use: Never used  Substance and Sexual Activity   Alcohol use: No   Drug use: No   Sexual activity: Yes    Birth control/protection: I.U.D.  Other Topics Concern   Not on file  Social History Narrative   Not on file   Social Determinants of Health   Financial Resource Strain: Low Risk  (03/27/2019)   Overall Financial Resource Strain (CARDIA)    Difficulty of Paying Living Expenses: Not hard at all  Food Insecurity: No Food Insecurity (03/27/2019)   Hunger Vital Sign    Worried About Running Out of Food in the Last Year: Never true    Ran Out of Food in the Last Year: Never true  Transportation Needs: No Transportation Needs (03/27/2019)   PRAPARE - Administrator, Civil Service (Medical): No  Lack of Transportation (Non-Medical): No  Physical Activity: Insufficiently Active (03/27/2019)   Exercise Vital Sign    Days of Exercise per Week: 3 days    Minutes of Exercise per Session: 30 min  Stress: No Stress Concern Present (03/27/2019)   [employer] of Occupational Health - Occupational Stress Questionnaire    Feeling of Stress : Not at all  Social Connections: Moderately Integrated (03/27/2019)   Social Connection and Isolation Panel [NHANES]    Frequency of Communication with Friends and Family: More than three times a week    Frequency of Social Gatherings with Friends and Family: More than three times a week    Attends Religious Services: More than 4 times per year    Active Member of [employer] or Organizations: No    Attends Engineer, structural: Never    Marital Status: Married  Careers information officer Violence: Not on file    Family History  Problem Relation Age of Onset   Cancer Father 70       LEUKEMIA   Diabetes Maternal Uncle    Heart Problems Maternal Uncle        OPEN HEART SURGERY   Diabetes Maternal Uncle     The following portions of the patient's history were reviewed and updated as appropriate: allergies, current medications, past OB history, past medical history, past surgical history, past family history, past social history, and problem list.    OBJECTIVE: Initial Physical Exam (New OB)  GENERAL APPEARANCE: alert, well appearing, in no apparent distress, oriented to person, place and time HEAD: normocephalic, atraumatic MOUTH: mucous membranes moist, pharynx normal without lesions THYROID: no thyromegaly or masses present BREASTS: no masses noted, no significant tenderness, no palpable axillary nodes, no skin changes LUNGS: clear to auscultation, no wheezes, rales or rhonchi, symmetric air entry HEART: regular rate and rhythm, no murmurs ABDOMEN: soft, nontender, nondistended, no abnormal masses, no epigastric pain and FHT present EXTREMITIES: no redness or tenderness in the calves or thighs SKIN: normal coloration and turgor, no rashes LYMPH NODES: no adenopathy palpable NEUROLOGIC: alert, oriented, normal speech, no focal findings or movement disorder noted  PELVIC EXAM EXTERNAL GENITALIA: normal appearing vulva with no masses, tenderness or lesions VAGINA: no abnormal discharge or lesions CERVIX: no lesions or cervical motion tenderness, pap collected UTERUS: gravid ADNEXA: no masses palpable and nontender OB EXAM PELVIMETRY: appears adequate RECTUM: exam not indicated  ASSESSMENT: Normal pregnancy  PLAN: Prenatal care See ordersNew OB counseling: The patient has been given an overview regarding routine prenatal care. Recommendations regarding diet, weight gain, and exercise in pregnancy were given. Prenatal testing, optional genetic  testing, carrier screening, and ultrasound use in pregnancy were reviewed.  Benefits of Breast Feeding were discussed. The patient is encouraged to consider nursing her baby post partum.   Doreene Burke, CNM

## 2023-06-22 LAB — CBC/D/PLT+RPR+RH+ABO+RUBIGG...
Antibody Screen: NEGATIVE
Basophils Absolute: 0 10*3/uL (ref 0.0–0.2)
Basos: 0 %
EOS (ABSOLUTE): 0.2 10*3/uL (ref 0.0–0.4)
Eos: 2 %
HCV Ab: NONREACTIVE
HIV Screen 4th Generation wRfx: NONREACTIVE
Hematocrit: 39.5 % (ref 34.0–46.6)
Hemoglobin: 13.8 g/dL (ref 11.1–15.9)
Hepatitis B Surface Ag: NEGATIVE
Immature Grans (Abs): 0.1 10*3/uL (ref 0.0–0.1)
Immature Granulocytes: 1 %
Lymphocytes Absolute: 1.7 10*3/uL (ref 0.7–3.1)
Lymphs: 14 %
MCH: 30.9 pg (ref 26.6–33.0)
MCHC: 34.9 g/dL (ref 31.5–35.7)
MCV: 89 fL (ref 79–97)
Monocytes Absolute: 0.9 10*3/uL (ref 0.1–0.9)
Monocytes: 8 %
Neutrophils Absolute: 8.8 10*3/uL — ABNORMAL HIGH (ref 1.4–7.0)
Neutrophils: 75 %
Platelets: 346 10*3/uL (ref 150–450)
RBC: 4.46 x10E6/uL (ref 3.77–5.28)
RDW: 13 % (ref 11.7–15.4)
RPR Ser Ql: NONREACTIVE
Rh Factor: POSITIVE
Rubella Antibodies, IGG: 1.24 index (ref >0.99)
Varicella zoster IgG: 940 index (ref >165)
WBC: 11.7 10*3/uL — ABNORMAL HIGH (ref 3.4–10.8)

## 2023-06-22 LAB — URINALYSIS, ROUTINE W REFLEX MICROSCOPIC
Bilirubin, UA: NEGATIVE
Glucose, UA: NEGATIVE
Leukocytes,UA: NEGATIVE
Nitrite, UA: NEGATIVE
Protein,UA: NEGATIVE
RBC, UA: NEGATIVE
Specific Gravity, UA: 1.02 (ref 1.005–1.030)
Urobilinogen, Ur: 0.2 mg/dL (ref 0.2–1.0)
pH, UA: 5.5 (ref 5.0–7.5)

## 2023-06-22 LAB — HCV INTERPRETATION

## 2023-06-23 LAB — URINE CYTOLOGY ANCILLARY ONLY
Chlamydia: NEGATIVE
Comment: NEGATIVE
Comment: NORMAL
Neisseria Gonorrhea: NEGATIVE

## 2023-06-26 LAB — MATERNIT 21 PLUS CORE, BLOOD
Fetal Fraction: 6
Result (T21): NEGATIVE
Trisomy 13 (Patau syndrome): NEGATIVE
Trisomy 18 (Edwards syndrome): NEGATIVE
Trisomy 21 (Down syndrome): NEGATIVE

## 2023-06-27 LAB — URINE CULTURE, OB REFLEX

## 2023-06-27 LAB — CULTURE, OB URINE

## 2023-06-28 LAB — CYTOLOGY - PAP
Diagnosis: NEGATIVE
Diagnosis: REACTIVE

## 2023-06-29 ENCOUNTER — Other Ambulatory Visit: Payer: Self-pay | Admitting: Certified Nurse Midwife

## 2023-06-29 ENCOUNTER — Encounter: Payer: Self-pay | Admitting: Certified Nurse Midwife

## 2023-06-29 MED ORDER — MONISTAT 7 COMBO PACK APP 100 & 2 MG-% (9GM) VA KIT: 1.0000 | PACK | Freq: Every evening | VAGINAL | 0 refills | Status: AC | PRN

## 2023-06-30 LAB — NICOTINE SCREEN, URINE

## 2023-06-30 LAB — MONITOR DRUG PROFILE 14(MW)

## 2023-07-18 ENCOUNTER — Encounter: Payer: Managed Care, Other (non HMO) | Admitting: Licensed Practical Nurse

## 2023-07-26 ENCOUNTER — Encounter: Payer: Self-pay | Admitting: Certified Nurse Midwife

## 2023-07-26 ENCOUNTER — Ambulatory Visit (INDEPENDENT_AMBULATORY_CARE_PROVIDER_SITE_OTHER): Payer: Managed Care, Other (non HMO) | Admitting: Certified Nurse Midwife

## 2023-07-26 VITALS — BP 113/76 | HR 76 | Wt 152.1 lb

## 2023-07-26 DIAGNOSIS — Z3A17 17 weeks gestation of pregnancy: Secondary | ICD-10-CM

## 2023-07-26 DIAGNOSIS — Z3492 Encounter for supervision of normal pregnancy, unspecified, second trimester: Secondary | ICD-10-CM

## 2023-07-26 LAB — POCT URINALYSIS DIPSTICK OB
Bilirubin, UA: NEGATIVE
Blood, UA: NEGATIVE
Glucose, UA: NEGATIVE
Ketones, UA: NEGATIVE
Nitrite, UA: NEGATIVE
Spec Grav, UA: 1.01 (ref 1.010–1.025)
Urobilinogen, UA: 0.2 E.U./dL
pH, UA: 7.5 (ref 5.0–8.0)

## 2023-07-26 NOTE — Patient Instructions (Signed)
Round Ligament Pain  The round ligaments are a pair of cord-like tissues that help support the uterus. They can become a source of pain during pregnancy as the ligaments soften and stretch as the baby grows. The pain usually begins in the second trimester (13-28 weeks) of pregnancy, and should only last for a few seconds when it occurs. However, the pain can come and go until the baby is delivered. The pain does not cause harm to the baby. Round ligament pain is usually a short, sharp, and pinching pain, but it can also be a dull, lingering, and aching pain. The pain is felt in the lower side of the abdomen or in the groin. It usually starts deep in the groin and moves up to the outside of the hip area. The pain may happen when you: Suddenly change position, such as quickly going from a sitting to standing position. Do physical activity. Cough or sneeze. Follow these instructions at home: Managing pain  When the pain starts, relax. Then, try any of these methods to help with the pain: Sit down. Flex your knees up to your abdomen. Lie on your side with one pillow under your abdomen and another pillow between your legs. Sit in a warm bath for 15-20 minutes or until the pain goes away. General instructions Watch your condition for any changes. Move slowly when you sit down or stand up. Stop or reduce your physical activities if they cause pain. Avoid long walks if they cause pain. Take over-the-counter and prescription medicines only as told by your health care provider. Keep all follow-up visits. This is important. Contact a health care provider if: Your pain does not go away with treatment. You feel pain in your back that you did not have before. Your medicine is not helping. You have a fever or chills. You have nausea or vomiting. You have diarrhea. You have pain when you urinate. Get help right away if: You have pain that is a rhythmic, cramping pain similar to labor pains. Labor  pains are usually 2 minutes apart, last for about 1 minute, and involve a bearing down feeling or pressure in your pelvis. You have vaginal bleeding. These symptoms may represent a serious problem that is an emergency. Do not wait to see if the symptoms will go away. Get medical help right away. Call your local emergency services (911 in the [place] Do not drive yourself to the hospital. Summary Round ligament pain is felt in the lower abdomen or groin. This pain usually begins in the second trimester (13-28 weeks) and should only last for a few seconds when it occurs. You may notice the pain when you suddenly change position, when you cough or sneeze, or during physical activity. Relaxing, flexing your knees to your abdomen, lying on one side, or taking a warm bath may help to get rid of the pain. Contact your health care provider if the pain does not go away. This information is not intended to replace advice given to you by your health care provider. Make sure you discuss any questions you have with your health care provider. Document Revised: 02/11/2021 Document Reviewed: 02/11/2021 Elsevier Patient Education  2024 Elsevier Inc.  

## 2023-07-26 NOTE — Addendum Note (Signed)
Addended by: Fonda Kinder on: 07/26/2023 10:03 AM   Modules accepted: Orders

## 2023-07-26 NOTE — Progress Notes (Signed)
ROB doing well. Denies concerns today. States she has had some occasional dizziness. Reassurance given. Discussed cause and self help measures. Reviewed round ligament pain . Discussed u/s for anatomy next visit. She verbalizes and agrees. Discussed MD visit for TOLAC or scheduling repeat c/section. She will let us know when she has decided on delivery route.   Follow up 3 wks for u/s and ROB.   Doreene Burke, CNM

## 2023-08-01 ENCOUNTER — Ambulatory Visit (INDEPENDENT_AMBULATORY_CARE_PROVIDER_SITE_OTHER): Payer: Managed Care, Other (non HMO) | Admitting: Certified Nurse Midwife

## 2023-08-01 VITALS — BP 120/80 | HR 81 | Wt 151.0 lb

## 2023-08-01 DIAGNOSIS — Z3A18 18 weeks gestation of pregnancy: Secondary | ICD-10-CM

## 2023-08-01 DIAGNOSIS — O34219 Maternal care for unspecified type scar from previous cesarean delivery: Secondary | ICD-10-CM

## 2023-08-01 DIAGNOSIS — O9A219 Injury, poisoning and certain other consequences of external causes complicating pregnancy, unspecified trimester: Secondary | ICD-10-CM

## 2023-08-01 DIAGNOSIS — O0990 Supervision of high risk pregnancy, unspecified, unspecified trimester: Secondary | ICD-10-CM

## 2023-08-01 DIAGNOSIS — Z98891 History of uterine scar from previous surgery: Secondary | ICD-10-CM
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
## 2023-08-16 ENCOUNTER — Ambulatory Visit: Payer: Managed Care, Other (non HMO)

## 2023-08-16 ENCOUNTER — Ambulatory Visit (INDEPENDENT_AMBULATORY_CARE_PROVIDER_SITE_OTHER): Payer: Managed Care, Other (non HMO)

## 2023-08-16 VITALS — BP 116/68 | HR 71 | Wt 154.0 lb

## 2023-08-16 DIAGNOSIS — O0992 Supervision of high risk pregnancy, unspecified, second trimester: Secondary | ICD-10-CM

## 2023-08-16 DIAGNOSIS — Z3689 Encounter for other specified antenatal screening: Secondary | ICD-10-CM | POA: Diagnosis not present

## 2023-08-16 DIAGNOSIS — Z3A17 17 weeks gestation of pregnancy: Secondary | ICD-10-CM

## 2023-08-16 DIAGNOSIS — O0990 Supervision of high risk pregnancy, unspecified, unspecified trimester: Secondary | ICD-10-CM

## 2023-08-16 DIAGNOSIS — Z0283 Encounter for blood-alcohol and blood-drug test: Secondary | ICD-10-CM

## 2023-08-16 DIAGNOSIS — Z3A20 20 weeks gestation of pregnancy: Secondary | ICD-10-CM

## 2023-08-16 DIAGNOSIS — Z3A21 21 weeks gestation of pregnancy: Secondary | ICD-10-CM | POA: Diagnosis not present

## 2023-08-16 LAB — POCT URINALYSIS DIPSTICK OB
Bilirubin, UA: NEGATIVE
Blood, UA: NEGATIVE
Glucose, UA: NEGATIVE
Ketones, UA: NEGATIVE
Leukocytes, UA: NEGATIVE
Nitrite, UA: NEGATIVE
POC,PROTEIN,UA: NEGATIVE
Spec Grav, UA: <=1.005 — AB (ref 1.010–1.025)
Urobilinogen, UA: 0.2 U/dL
pH, UA: 6 (ref 5.0–8.0)

## 2023-08-16 NOTE — Progress Notes (Signed)
    Return Prenatal Note   Assessment/Plan   Plan  27 y.o. G3P2002 at [redacted]w[redacted]d presents for follow-up OB visit. Reviewed prenatal record including previous visit note.  Supervision of high risk pregnancy, antepartum - Anatomy ultrasound completed today. Normal AFI, placenta, and anatomy.  - Reviewed red flag warning signs anticipatory guidance for upcoming prenatal care.    Orders Placed This Encounter  Procedures   Nicotine screen, urine   Monitor Drug Profile 14(MW)   POC Urinalysis Dipstick OB   Return in about 4 weeks (around 09/13/2023) for ROB.   Future Appointments  Date Time Provider Department Center  08/16/2023  9:35 AM Lurena Naeve, Lindalou Hose, CNM [facility] None    For next visit:  continue with routine prenatal care     Subjective   27 y.o. G4W1027 at [redacted]w[redacted]d presents for this follow-up prenatal visit.  Patient has no concerns today. Patient reports: Movement: Present Contractions: Not present  Objective   Flow sheet Vitals: Pulse Rate: 71 BP: 116/68 Fundal Height: 20 cm Fetal Heart Rate (bpm): 136 by [place] Total weight gain: 3 lb (1.361 kg)  General Appearance  No acute distress, well appearing, and well nourished Pulmonary   Normal work of breathing Neurologic   Alert and oriented to person, place, and time Psychiatric   Mood and affect within normal limits  Lindalou Hose Aaliyan Brinkmeier, CNM  09/03/249:09 AM

## 2023-08-16 NOTE — Assessment & Plan Note (Addendum)
-   Anatomy ultrasound completed today. Normal AFI, placenta, and anatomy.  - Reviewed red flag warning signs anticipatory guidance for upcoming prenatal care.

## 2023-08-17 LAB — NICOTINE SCREEN, URINE: Cotinine Ql Scrn, Ur: NEGATIVE ng/mL

## 2023-09-13 ENCOUNTER — Ambulatory Visit (INDEPENDENT_AMBULATORY_CARE_PROVIDER_SITE_OTHER): Payer: Managed Care, Other (non HMO) | Admitting: Licensed Practical Nurse

## 2023-09-13 ENCOUNTER — Encounter: Payer: Self-pay | Admitting: Licensed Practical Nurse

## 2023-09-13 VITALS — BP 113/66 | HR 98 | Wt 159.4 lb

## 2023-09-13 DIAGNOSIS — Z131 Encounter for screening for diabetes mellitus: Secondary | ICD-10-CM

## 2023-09-13 DIAGNOSIS — Z3A24 24 weeks gestation of pregnancy: Secondary | ICD-10-CM

## 2023-09-13 DIAGNOSIS — O0990 Supervision of high risk pregnancy, unspecified, unspecified trimester: Secondary | ICD-10-CM

## 2023-09-13 LAB — POCT URINALYSIS DIPSTICK
Bilirubin, UA: NEGATIVE
Blood, UA: NEGATIVE
Glucose, UA: NEGATIVE
Ketones, UA: NEGATIVE
Leukocytes, UA: NEGATIVE
Nitrite, UA: NEGATIVE
Protein, UA: NEGATIVE
Spec Grav, UA: 1.02 (ref 1.010–1.025)
Urobilinogen, UA: 0.2 U/dL
pH, UA: 6.5 (ref 5.0–8.0)

## 2023-09-13 NOTE — Progress Notes (Signed)
Routine Prenatal Care Visit  Subjective  Cindy Aguilar is a 27 y.o. G3P2002 at [redacted]w[redacted]d being seen today for ongoing prenatal care.  She is currently monitored for the following issues for this high-risk pregnancy and has Supervision of high risk pregnancy, antepartum; History of primary cesarean section; and History of cesarean delivery on their problem list.  ----------------------------------------------------------------------------------- Patient reports heartburn.  Tums is not helping, rec small frequent meals, avoid triggers, Famotidine ok.  -here with Jonny Ruiz, he was in a recent accident and is a wheelchair until the end of the year -they have a 9 and 4 year at home, they have family nearby for support -reviewed GDM screening and TDAP next visit  -Desires TOLAC and BTL   Contractions: Not present. Vag. Bleeding: None.  Movement: Present. Leaking Fluid denies.  ----------------------------------------------------------------------------------- The following portions of the patient's history were reviewed and updated as appropriate: allergies, current medications, past family history, past medical history, past social history, past surgical history and problem list. Problem list updated.  Objective  Blood pressure 113/66, pulse 98, weight 159 lb 6.4 oz (72.3 kg), unknown if currently breastfeeding. Pregravid weight 151 lb (68.5 kg) Total Weight Gain 8 lb 6.4 oz (3.81 kg) Urinalysis: Urine Protein    Urine Glucose    Fetal Status: Fetal Heart Rate (bpm): 140 Fundal Height: 24 cm Movement: Present     General:  Alert, oriented and cooperative. Patient is in no acute distress.  Skin: Skin is warm and dry. No rash noted.   Cardiovascular: Normal heart rate noted  Respiratory: Normal respiratory effort, no problems with respiration noted  Abdomen: Soft, gravid, appropriate for gestational age. Pain/Pressure: Absent     Pelvic:  Cervical exam deferred        Extremities: Normal range of  motion.  Edema: Mild pitting, slight indentation  Mental Status: Normal mood and affect. Normal behavior. Normal judgment and thought content.   Assessment   27 y.o. [id number] at [redacted]w[redacted]d by  12/30/2023, by Ultrasound presenting for routine prenatal visit  Plan   3rd Pregnancy Problems (from 06/21/23 to present)     No problems associated with this episode.        Preterm labor symptoms and general obstetric precautions including but not limited to vaginal bleeding, contractions, leaking of fluid and fetal movement were reviewed in detail with the patient. Please refer to After Visit Summary for other counseling recommendations.   Return in about 4 weeks (around 10/11/2023) for ROB, 28wk labs,, needs MD visit . Carie Caddy, CNM   [facility]  09/13/23  1:03 PM

## 2023-10-03 ENCOUNTER — Ambulatory Visit: Payer: Managed Care, Other (non HMO)

## 2023-10-03 ENCOUNTER — Other Ambulatory Visit: Payer: Managed Care, Other (non HMO)

## 2023-10-03 VITALS — BP 119/74 | HR 86 | Wt 161.7 lb

## 2023-10-03 DIAGNOSIS — O0990 Supervision of high risk pregnancy, unspecified, unspecified trimester: Secondary | ICD-10-CM

## 2023-10-03 DIAGNOSIS — O34219 Maternal care for unspecified type scar from previous cesarean delivery: Secondary | ICD-10-CM

## 2023-10-03 DIAGNOSIS — Z23 Encounter for immunization: Secondary | ICD-10-CM | POA: Diagnosis not present

## 2023-10-03 DIAGNOSIS — Z131 Encounter for screening for diabetes mellitus: Secondary | ICD-10-CM

## 2023-10-03 DIAGNOSIS — Z3A27 27 weeks gestation of pregnancy: Secondary | ICD-10-CM

## 2023-10-03 DIAGNOSIS — Z98891 History of uterine scar from previous surgery: Secondary | ICD-10-CM

## 2023-10-03 NOTE — Assessment & Plan Note (Addendum)
-   One hour glucola, third trimester labs, Tdap, and BTFC today in clinic. - Flu vaccine given at work about 2 weeks ago. - Reviewed kick counts and preterm labor warning signs. Instructed to call office or come to hospital with persistent headache, vision changes, regular contractions, leaking of fluid, decreased fetal movement or vaginal bleeding.

## 2023-10-03 NOTE — Assessment & Plan Note (Addendum)
-   Reviewed need to schedule MD visit for TOLAC and BTL counseling.

## 2023-10-03 NOTE — Progress Notes (Signed)
    Return Prenatal Note   Assessment/Plan   Plan  27 y.o. G3P2002 at [redacted]w[redacted]d presents for follow-up OB visit. Reviewed prenatal record including previous visit note.  Supervision of high risk pregnancy, antepartum - One hour glucola, third trimester labs, Tdap, and BTFC today in clinic. - Flu vaccine given at work about 2 weeks ago. - Reviewed kick counts and preterm labor warning signs. Instructed to call office or come to hospital with persistent headache, vision changes, regular contractions, leaking of fluid, decreased fetal movement or vaginal bleeding.  History of cesarean delivery - Reviewed need to schedule MD visit for TOLAC and BTL counseling.    Orders Placed This Encounter  Procedures   Tdap vaccine greater than or equal to 7yo IM   Return in about 2 weeks (around 10/17/2023) for ROB with MD for TOLAC counseling.   Future Appointments  Date Time Provider Department Center  10/17/2023  9:15 AM Allie Bossier, MD [facility] None    For next visit:   ROB with TOLAC/BTL counseling.     Subjective   27 y.o. N2T5573 at [redacted]w[redacted]d presents for this follow-up prenatal visit.  Patient has no concerns. Patient reports: Movement: Present Contractions: Not present  Objective   Flow sheet Vitals: Pulse Rate: 86 BP: 119/74 Fundal Height: 28 cm Fetal Heart Rate (bpm): 150 Total weight gain: 10 lb 11.2 oz (4.853 kg)  General Appearance  No acute distress, well appearing, and well nourished Pulmonary   Normal work of breathing Neurologic   Alert and oriented to person, place, and time Psychiatric   Mood and affect within normal limits  Lindalou Hose Nubia Ziesmer, CNM  10/02/2409:56 AM

## 2023-10-04 LAB — 28 WEEK RH+PANEL
Basophils Absolute: 0 10*3/uL (ref 0.0–0.2)
Basos: 0 %
EOS (ABSOLUTE): 0.1 10*3/uL (ref 0.0–0.4)
Eos: 1 %
Gestational Diabetes Screen: 80 mg/dL (ref 70–139)
HIV Screen 4th Generation wRfx: NONREACTIVE
Hematocrit: 33.7 % — ABNORMAL LOW (ref 34.0–46.6)
Hemoglobin: 10.9 g/dL — ABNORMAL LOW (ref 11.1–15.9)
Immature Grans (Abs): 0.1 10*3/uL (ref 0.0–0.1)
Immature Granulocytes: 1 %
Lymphocytes Absolute: 1.1 10*3/uL (ref 0.7–3.1)
Lymphs: 13 %
MCH: 29.1 pg (ref 26.6–33.0)
MCHC: 32.3 g/dL (ref 31.5–35.7)
MCV: 90 fL (ref 79–97)
Monocytes Absolute: 0.7 10*3/uL (ref 0.1–0.9)
Monocytes: 8 %
Neutrophils Absolute: 6.7 10*3/uL (ref 1.4–7.0)
Neutrophils: 77 %
Platelets: 286 10*3/uL (ref 150–450)
RBC: 3.74 x10E6/uL — ABNORMAL LOW (ref 3.77–5.28)
RDW: 12.7 % (ref 11.7–15.4)
RPR Ser Ql: NONREACTIVE
WBC: 8.8 10*3/uL (ref 3.4–10.8)

## 2023-10-17 ENCOUNTER — Encounter: Payer: Managed Care, Other (non HMO) | Admitting: Obstetrics & Gynecology

## 2023-10-17 ENCOUNTER — Ambulatory Visit (INDEPENDENT_AMBULATORY_CARE_PROVIDER_SITE_OTHER): Payer: Managed Care, Other (non HMO) | Admitting: Obstetrics

## 2023-10-17 VITALS — BP 120/80 | Wt 164.0 lb

## 2023-10-17 DIAGNOSIS — O34219 Maternal care for unspecified type scar from previous cesarean delivery: Secondary | ICD-10-CM

## 2023-10-17 DIAGNOSIS — O0990 Supervision of high risk pregnancy, unspecified, unspecified trimester: Secondary | ICD-10-CM

## 2023-10-17 DIAGNOSIS — Z3009 Encounter for other general counseling and advice on contraception: Secondary | ICD-10-CM

## 2023-10-17 DIAGNOSIS — Z3A29 29 weeks gestation of pregnancy: Secondary | ICD-10-CM

## 2023-10-17 NOTE — Progress Notes (Signed)
    Return Prenatal Note   Subjective  27 y.o. [id number] at [redacted]w[redacted]d presents for this follow-up prenatal visit with her husband.  Patient doing well, wants to discuss TOLAC and BTL today. First CD was for arrest of dilation at 7cm, with NRFHT and second CD was an elective repeat.  Patient reports: Movement: Present Contractions: Irritability Denies vaginal bleeding or leaking fluid. Objective  Flow sheet Vitals: BP: 120/80 Fundal Height: 30 cm Fetal Heart Rate (bpm): 157 Total weight gain: 13 lb (5.897 kg)  General Appearance  No acute distress, well appearing, and well nourished Pulmonary   Normal work of breathing Neurologic   Alert and oriented to person, place, and time Psychiatric   Mood and affect within normal limits  Assessment/Plan   Plan  27 y.o. [id number] at [redacted]w[redacted]d by LMP=7wk [place] presents for follow-up OB visit. Reviewed prenatal record including previous visit note. 1. Previous cesarean delivery affecting pregnancy, antepartum -VBAC Calculator Score = 49% success -Discussed risks vs benefits of TOLAC vs repeat C-section.  Risk of uterine rupture at term is ~ 1%.  Risk of failed trial of labor after cesarean (TOLAC) without a vaginal birth after cesarean (VBAC) resulting in repeat cesarean delivery (RCD) in about 20 to 40 percent of women who attempt VBAC.  Risk of requiring emergency C-section due to uterine rupture discussed. The benefits of a trial of labor after cesarean (TOLAC) resulting in a vaginal birth after cesarean (VBAC) include the following: shorter length of hospital stay and postpartum recovery (in most cases); fewer complications, such as postpartum fever, wound or uterine infection, thromboembolism (blood clots in the leg or lung), need for blood transfusion and fewer neonatal breathing problems.  Patient notes understanding.  Still desires TOLAC.  Patient ok to proceed. Consent signed 10/17/2023.  2. Encounter for consultation for female sterilization -Patient  desires permanent sterilization.  Other reversible forms of contraception were discussed with patient; she declines all other modalities. Risks of procedure discussed with patient including but not limited to: risk of regret, permanence of method, bleeding, infection, injury to surrounding organs and need for additional procedures.  Failure risk of about 1% with increased risk of ectopic gestation if pregnancy occurs was also discussed with patient.  Also discussed possibility of post-tubal pain syndrome. Patient verbalized understanding of these risks and wants to proceed with sterilization.  Return in about 2 weeks (around 10/31/2023) for ROB.   Future Appointments  Date Time Provider Department Center  10/31/2023  9:35 AM Tresea Mall, CNM [facility] None    For next visit:   discuss RSV vaccine   Julieanne Manson, DO [facility]

## 2023-10-31 ENCOUNTER — Encounter: Payer: Self-pay | Admitting: Advanced Practice Midwife

## 2023-10-31 ENCOUNTER — Ambulatory Visit (INDEPENDENT_AMBULATORY_CARE_PROVIDER_SITE_OTHER): Payer: Managed Care, Other (non HMO) | Admitting: Advanced Practice Midwife

## 2023-10-31 VITALS — BP 111/74 | HR 87 | Wt 166.0 lb

## 2023-10-31 DIAGNOSIS — O0993 Supervision of high risk pregnancy, unspecified, third trimester: Secondary | ICD-10-CM

## 2023-10-31 DIAGNOSIS — Z0289 Encounter for other administrative examinations: Secondary | ICD-10-CM

## 2023-10-31 DIAGNOSIS — Z3A31 31 weeks gestation of pregnancy: Secondary | ICD-10-CM

## 2023-10-31 NOTE — Progress Notes (Signed)
Routine Prenatal Care Visit  Subjective  Cindy Aguilar is a 27 y.o. G3P2002 at [redacted]w[redacted]d being seen today for ongoing prenatal care.  She is currently monitored for the following issues for this high-risk pregnancy and has Supervision of high risk pregnancy, antepartum; History of primary cesarean section; and Previous cesarean delivery affecting pregnancy, antepartum on their problem list.  ----------------------------------------------------------------------------------- Patient reports no complaints.  She consented for VBAC at last visit. All of her questions were answered. She feels well and has good fetal movement. Contractions: Not present. Vag. Bleeding: None.  Movement: Present. Leaking Fluid denies.  ----------------------------------------------------------------------------------- The following portions of the patient's history were reviewed and updated as appropriate: allergies, current medications, past family history, past medical history, past social history, past surgical history and problem list. Problem list updated.  Objective  Blood pressure 111/74, pulse 87, weight 166 lb (75.3 kg), last menstrual period 03/23/2023 Pregravid weight 151 lb (68.5 kg) Total Weight Gain 15 lb (6.804 kg) Urinalysis: Urine Protein    Urine Glucose    Fetal Status: Fetal Heart Rate (bpm): 154 Fundal Height: 32 cm Movement: Present     General:  Alert, oriented and cooperative. Patient is in no acute distress.  Skin: Skin is warm and dry. No rash noted.   Cardiovascular: Normal heart rate noted  Respiratory: Normal respiratory effort, no problems with respiration noted  Abdomen: Soft, gravid, appropriate for gestational age. Pain/Pressure: Absent     Pelvic:  Cervical exam deferred        Extremities: Normal range of motion.  Edema: None  Mental Status: Normal mood and affect. Normal behavior. Normal judgment and thought content.   Assessment   27 y.o. [id number] at [redacted]w[redacted]d by  12/28/2023, by Last  Menstrual Period presenting for routine prenatal visit  Plan   3rd Pregnancy Problems (from 06/21/23 to present)    Clinical Staff Provider  Office Location  [facility] Dating  LMP=7wk [place]  Language  English Anatomy US  Normal  Flu Vaccine  10/7 @ work Genetic Screen  NIPS: low risk, XX  TDaP vaccine   10/21 Hgb A1C or  GTT Early : Third trimester : 80  Covid    LAB RESULTS   Rhogam  B/Positive/-- (07/09 1548)  Blood Type B/Positive/-- (07/09 1548)   RSV  Antibody Negative (07/09 1548)  Feeding Plan  Rubella 1.24 (07/09 1548)  Contraception  RPR Non Reactive (07/09 1548)   Circumcision  HBsAg Negative (07/09 1548)   Pediatrician   HIV Non Reactive (07/09 1548)  Support Person  Varicella 940 (07/09 1548)  Prenatal Classes  GBS  (For PCN allergy, check sensitivities)     Hep C Non Reactive (07/09 1548)   BTL Consent Verbal 10/17/23 (Non-MC) Pap Diagnosis  Date Value Ref Range Status  06/21/2023      - Negative for Intraepithelial Lesions or Malignancy (NILM)  06/21/2023 - Benign reactive/reparative changes      VBAC Consent 10/17/23 Hgb Electro      CF      SMA          Preterm labor symptoms and general obstetric precautions including but not limited to vaginal bleeding, contractions, leaking of fluid and fetal movement were reviewed in detail with the patient. Please refer to After Visit Summary for other counseling recommendations.   Return in about 2 weeks (around 11/14/2023) for rob.  Tresea Mall, CNM 10/31/2023 9:52 AM

## 2023-10-31 NOTE — Addendum Note (Signed)
Addended by: Fonda Kinder on: 10/31/2023 10:24 AM   Modules accepted: Orders

## 2023-11-04 ENCOUNTER — Telehealth: Payer: Self-pay

## 2023-11-04 NOTE — Telephone Encounter (Signed)
Called Sher[employer] and left voicemail to let her know her FMLA forms are ready for pick up.

## 2023-11-14 ENCOUNTER — Ambulatory Visit (INDEPENDENT_AMBULATORY_CARE_PROVIDER_SITE_OTHER): Payer: Managed Care, Other (non HMO) | Admitting: Advanced Practice Midwife

## 2023-11-14 ENCOUNTER — Encounter: Payer: Self-pay | Admitting: Advanced Practice Midwife

## 2023-11-14 VITALS — BP 117/69 | HR 83 | Wt 170.3 lb

## 2023-11-14 DIAGNOSIS — O34219 Maternal care for unspecified type scar from previous cesarean delivery: Secondary | ICD-10-CM

## 2023-11-14 DIAGNOSIS — O0990 Supervision of high risk pregnancy, unspecified, unspecified trimester: Secondary | ICD-10-CM

## 2023-11-14 DIAGNOSIS — Z2911 Encounter for prophylactic immunotherapy for respiratory syncytial virus (RSV): Secondary | ICD-10-CM

## 2023-11-14 DIAGNOSIS — O0993 Supervision of high risk pregnancy, unspecified, third trimester: Secondary | ICD-10-CM

## 2023-11-14 DIAGNOSIS — Z23 Encounter for immunization: Secondary | ICD-10-CM

## 2023-11-14 DIAGNOSIS — Z3A33 33 weeks gestation of pregnancy: Secondary | ICD-10-CM

## 2023-11-14 LAB — POCT URINALYSIS DIPSTICK
Bilirubin, UA: NEGATIVE
Blood, UA: NEGATIVE
Glucose, UA: NEGATIVE
Ketones, UA: NEGATIVE
Leukocytes, UA: NEGATIVE
Nitrite, UA: NEGATIVE
Protein, UA: NEGATIVE
Spec Grav, UA: 1.015 (ref 1.010–1.025)
Urobilinogen, UA: 0.2 U/dL
pH, UA: 6.5 (ref 5.0–8.0)

## 2023-11-14 NOTE — Patient Instructions (Signed)
Vaginal Birth After Cesarean Delivery  Vaginal birth after cesarean delivery (VBAC) means giving birth vaginally after previously delivering a baby through a cesarean section, or C-section. VBAC may be a safe option for you, depending on your health and other factors. Discuss VBAC with your health care provider early in your pregnancy, so you can understand the risks, benefits, and options. Having these discussions early will give you time to make your birth plan. What increases the chances for a successful VBAC? These factors increase your chances of a successful VBAC: You have had only one previous C-section. You had a low transverse incision for your C-section. You have had a successful vaginal birth. Your labor starts naturally on or before your due date. You and your baby have had a healthy pregnancy. Your baby is head-down. What happens when I arrive at the birth center or hospital? Once you are in labor and have been admitted into the hospital or birth center, your health care team may: Review your pregnancy history and go over any concerns you have. Talk with you about your birth plan and discuss pain control options. Check your blood pressure, breathing rate, and heart rate. Check your baby's heartbeat. Monitor your uterus for contractions. Check whether your bag of water (amniotic sac) has broken (ruptured). Insert an IV into one of your veins. You may get fluids and medicine through the IV. Monitoring and exams Your health care team may check your contractions (uterine monitoring) and your baby's heart rate (fetal monitoring). You may need to be monitored for long periods at a time (continuously) with a monitoring device. Your health care provider may also do frequent physical exams. These may include checking how and where your baby is positioned in your uterus and checking your cervix to determine whether it is opening up (dilating) or thinning out (effacing). What happens during  labor and delivery? Normal labor and delivery are divided into the following three stages: Stage 1 This is the longest stage of labor. Throughout this stage, you will feel contractions. Contractions are generally mild, infrequent, and irregular at first. They get stronger, more frequent (about every 2-3 minutes), and more regular as you move through this stage. The first stage ends when your cervix is completely dilated to 4 inches (10 cm) and effaced. Stage 2 This stage starts once your cervix is completely dilated and effaced and lasts until you deliver your baby. In this stage, you will feel the urge to push your baby out of your vagina. You may feel stretching and burning pain, especially when the widest part of your baby's head passes through the vaginal opening (crowning). Once your baby is delivered, the umbilical cord will be clamped and cut. Your baby will be placed on your bare chest and covered with a warm blanket. If you are planning to breastfeed, watch your baby for feeding cues, like rooting or sucking. Stage 3 This stage starts immediately after your baby is born and ends after you deliver the placenta. This stage may take anywhere from 5 to 30 minutes. What can I expect after labor and delivery? After labor is over, you and your baby will be checked to make sure you are both healthy, and you will both be monitored until you are ready to go home. You may be encouraged to stay in the same room with your baby to promote early bonding and successful breastfeeding. Your uterus will be checked and massaged regularly (fundal massage). You may continue to receive fluids and medicines  through an IV. You will have some soreness and pain in your abdomen, vagina, and the area of skin between your vaginal opening and your anus (perineum). If an incision was made near your vagina (episiotomy) or if you had some vaginal tearing during delivery, cold compresses may be used to reduce pain and  swelling. Follow these instructions at home: Incision care If you have an episiotomy incision, follow instructions from your health care provider about how to take care of your incision. Check your incision area every day for signs of infection. Check for: Redness, swelling, or pain. More fluid or blood. Warmth. Pus or a bad smell. If directed, put ice on the episiotomy area. To do this: Put ice in a plastic bag. Place a towel between your skin and the bag. Leave the ice on for 20 minutes, 2-3 times a day. Remove the ice if your skin turns bright red. This is very important. If you cannot feel pain, heat, or cold, you have a greater risk of damage to the area. Activity Return to your normal activities as told by your health care provider. Ask your health care provider what activities are safe for you. Avoid sitting for a long time without moving. Get up to take short walks every 1-2 hours. General instructions Take over-the-counter and prescription medicines only as told by your health care provider. Do not take baths, swim, or use a hot tub until your health care provider approves. Ask if you may take showers. You may only be allowed to take sponge baths. It is normal to have vaginal bleeding after delivery. Wear a sanitary pad for vaginal bleeding and discharge. Keep all follow-up visits. This is important. Where to find more information American Pregnancy Association: americanpregnancy.org [employer]: acog.org Summary Vaginal birth after cesarean delivery (VBAC) means giving birth vaginally after previously delivering a baby through a cesarean section, or C-section. Once you are in labor and have been admitted into the hospital or birth center, your health care team may review your pregnancy history and go over any concerns you have. Although most women are able to have a successful VBAC, sometimes it is necessary to have another  C-section. Keep all follow-up visits. This is important. This information is not intended to replace advice given to you by your health care provider. Make sure you discuss any questions you have with your health care provider. Document Revised: 11/27/2020 Document Reviewed: 11/27/2020 Elsevier Patient Education  2024 [employer].

## 2023-11-14 NOTE — Progress Notes (Signed)
Routine Prenatal Care Visit  Subjective  Cindy Aguilar is a 27 y.o. G3P2002 at [redacted]w[redacted]d being seen today for ongoing prenatal care.  She is currently monitored for the following issues for this high-risk pregnancy and has Supervision of high risk pregnancy, antepartum; History of primary cesarean section; and Previous cesarean delivery affecting pregnancy, antepartum on their problem list.  ----------------------------------------------------------------------------------- Patient reports she is doing well. Having some swelling in her hands.   Contractions: Not present. Vag. Bleeding: None.  Movement: Present. Leaking Fluid denies.  ----------------------------------------------------------------------------------- The following portions of the patient's history were reviewed and updated as appropriate: allergies, current medications, past family history, past medical history, past social history, past surgical history and problem list. Problem list updated.  Objective  Blood pressure 117/69, pulse 83, weight 170 lb 4.8 oz (77.2 kg), last menstrual period 03/23/2023 Pregravid weight 151 lb (68.5 kg) Total Weight Gain 19 lb 4.8 oz (8.754 kg) Urinalysis: Urine Protein    Urine Glucose    Fetal Status: Fetal Heart Rate (bpm): 139 Fundal Height: 34 cm Movement: Present     General:  Alert, oriented and cooperative. Patient is in no acute distress.  Skin: Skin is warm and dry. No rash noted.   Cardiovascular: Normal heart rate noted  Respiratory: Normal respiratory effort, no problems with respiration noted  Abdomen: Soft, gravid, appropriate for gestational age. Pain/Pressure: Absent     Pelvic:  Cervical exam deferred        Extremities: Normal range of motion.  Edema: Trace  Mental Status: Normal mood and affect. Normal behavior. Normal judgment and thought content.   Assessment   27 y.o. G3P2002 at [redacted]w[redacted]d by  12/28/2023, by Last Menstrual Period presenting for routine prenatal visit  Plan    3rd Pregnancy Problems (from 06/21/23 to present)     Problem Noted Resolved   Supervision of high risk pregnancy, antepartum 08/04/2018 by Tresea Mall, CNM No   Overview Addendum 10/17/2023  1:49 PM by Julieanne Manson, MD     Clinical Staff Provider  Office Location  [facility] Dating  LMP=7wk [place]  Language  English Anatomy US  Normal  Flu Vaccine  10/7 @ work Genetic Screen  NIPS: low risk, XX  TDaP vaccine   10/21 Hgb A1C or  GTT Early : Third trimester : 80  Covid    LAB RESULTS   Rhogam  B/Positive/-- (07/09 1548)  Blood Type B/Positive/-- (07/09 1548)   RSV  Antibody Negative (07/09 1548)  Feeding Plan  Rubella 1.24 (07/09 1548)  Contraception  RPR Non Reactive (07/09 1548)   Circumcision  HBsAg Negative (07/09 1548)   Pediatrician   HIV Non Reactive (07/09 1548)  Support Person  Varicella 940 (07/09 1548)  Prenatal Classes  GBS  (For PCN allergy, check sensitivities)     Hep C Non Reactive (07/09 1548)   BTL Consent Verbal 10/17/23 (Non-MC) Pap Diagnosis  Date Value Ref Range Status  06/21/2023      - Negative for Intraepithelial Lesions or Malignancy (NILM)  06/21/2023 - Benign reactive/reparative changes      VBAC Consent 10/17/23 Hgb Electro      CF      SMA                     Preterm labor symptoms and general obstetric precautions including but not limited to vaginal bleeding, contractions, leaking of fluid and fetal movement were reviewed in detail with the patient. Please refer to After Visit Summary  for other counseling recommendations.   Return in about 2 weeks (around 11/28/2023) for rob.  Tresea Mall, CNM 11/14/2023 11:06 AM

## 2023-11-28 ENCOUNTER — Other Ambulatory Visit (HOSPITAL_COMMUNITY)
Admission: RE | Admit: 2023-11-28 | Discharge: 2023-11-28 | Disposition: A | Discharge status: Home / Self Care | Payer: Managed Care, Other (non HMO) | Source: Ambulatory Visit | Attending: Certified Nurse Midwife | Admitting: Certified Nurse Midwife

## 2023-11-28 ENCOUNTER — Ambulatory Visit (INDEPENDENT_AMBULATORY_CARE_PROVIDER_SITE_OTHER): Payer: Managed Care, Other (non HMO) | Admitting: Certified Nurse Midwife

## 2023-11-28 ENCOUNTER — Encounter: Payer: Managed Care, Other (non HMO) | Admitting: Certified Nurse Midwife

## 2023-11-28 VITALS — BP 114/78 | HR 83 | Wt 174.5 lb

## 2023-11-28 DIAGNOSIS — Z3685 Encounter for antenatal screening for Streptococcus B: Secondary | ICD-10-CM

## 2023-11-28 DIAGNOSIS — Z113 Encounter for screening for infections with a predominantly sexual mode of transmission: Secondary | ICD-10-CM | POA: Insufficient documentation

## 2023-11-28 DIAGNOSIS — Z3A35 35 weeks gestation of pregnancy: Secondary | ICD-10-CM

## 2023-11-28 NOTE — Progress Notes (Signed)
ROB doing well, GBS and cultures , self swab collected.   Preterm labor precautions reviewed.   Follow up 1 wk.   Doreene Burke, CNM

## 2023-11-28 NOTE — Addendum Note (Signed)
Addended by: Sheliah Hatch on: 11/28/2023 11:29 AM   Modules accepted: Orders

## 2023-11-28 NOTE — Patient Instructions (Signed)

## 2023-11-29 LAB — CERVICOVAGINAL ANCILLARY ONLY
Chlamydia: NEGATIVE
Comment: NEGATIVE
Comment: NORMAL
Neisseria Gonorrhea: NEGATIVE

## 2023-11-30 LAB — STREP GP B NAA: Strep Gp B NAA: POSITIVE — AB

## 2023-12-05 ENCOUNTER — Encounter: Payer: Managed Care, Other (non HMO) | Admitting: Obstetrics

## 2023-12-08 ENCOUNTER — Other Ambulatory Visit: Payer: Self-pay | Admitting: Licensed Practical Nurse

## 2023-12-08 ENCOUNTER — Ambulatory Visit (INDEPENDENT_AMBULATORY_CARE_PROVIDER_SITE_OTHER): Payer: Managed Care, Other (non HMO) | Admitting: Licensed Practical Nurse

## 2023-12-08 ENCOUNTER — Encounter: Payer: Self-pay | Admitting: Licensed Practical Nurse

## 2023-12-08 VITALS — BP 116/80 | HR 71 | Wt 179.5 lb

## 2023-12-08 DIAGNOSIS — O26843 Uterine size-date discrepancy, third trimester: Secondary | ICD-10-CM

## 2023-12-08 DIAGNOSIS — Z3A37 37 weeks gestation of pregnancy: Secondary | ICD-10-CM

## 2023-12-08 DIAGNOSIS — O0990 Supervision of high risk pregnancy, unspecified, unspecified trimester: Secondary | ICD-10-CM

## 2023-12-08 DIAGNOSIS — O0993 Supervision of high risk pregnancy, unspecified, third trimester: Secondary | ICD-10-CM

## 2023-12-08 LAB — POCT URINALYSIS DIPSTICK
Bilirubin, UA: NEGATIVE
Blood, UA: POSITIVE
Glucose, UA: NEGATIVE
Ketones, UA: NEGATIVE
Nitrite, UA: NEGATIVE
Protein, UA: NEGATIVE
Spec Grav, UA: 1.015 (ref 1.010–1.025)
Urobilinogen, UA: 0.2 U/dL
pH, UA: 6.5 (ref 5.0–8.0)

## 2023-12-08 NOTE — Addendum Note (Signed)
Addended by: Burtis Junes on: 12/08/2023 02:17 PM   Modules accepted: Orders

## 2023-12-08 NOTE — Progress Notes (Signed)
Routine Prenatal Care Visit  Subjective  Cindy Aguilar is a 27 y.o. G3P2002 at [redacted]w[redacted]d being seen today for ongoing prenatal care.  She is currently monitored for the following issues for this low-risk pregnancy and has Supervision of high risk pregnancy, antepartum; History of primary cesarean section; and Previous cesarean delivery affecting pregnancy, antepartum on their problem list.  ----------------------------------------------------------------------------------- Patient reports  general third trimester discomforts .   -doing well. Mood is good, feels exhausted.  -Breastfed her youngest child for 2 years -S>D, [place] ordered -reviewed GSB pos  -desires BTL, consent signed, has commercial insurance   Contractions: Irritability. Vag. Bleeding: None.  Movement: Present. Leaking Fluid denies.  ----------------------------------------------------------------------------------- The following portions of the patient's history were reviewed and updated as appropriate: allergies, current medications, past family history, past medical history, past social history, past surgical history and problem list. Problem list updated.  Objective  Blood pressure 116/80, pulse 71, weight 179 lb 8 oz (81.4 kg), last menstrual period 03/23/2023, unknown if currently breastfeeding. Pregravid weight 151 lb (68.5 kg) Total Weight Gain 28 lb 8 oz (12.9 kg) Urinalysis: Urine Protein    Urine Glucose    Fetal Status: Fetal Heart Rate (bpm): 135 Fundal Height: 40 cm Movement: Present     General:  Alert, oriented and cooperative. Patient is in no acute distress.  Skin: Skin is warm and dry. No rash noted.   Cardiovascular: Normal heart rate noted  Respiratory: Normal respiratory effort, no problems with respiration noted  Abdomen: Soft, gravid, appropriate for gestational age. Pain/Pressure: Present     Pelvic:  Cervical exam deferred        Extremities: Normal range of motion.  Edema: Trace  Mental Status: Normal  mood and affect. Normal behavior. Normal judgment and thought content.   Assessment   27 y.o. G3P2002 at [redacted]w[redacted]d by  12/28/2023, by Last Menstrual Period presenting for routine prenatal visit  Plan   3rd Pregnancy Problems (from 06/21/23 to present)     Problem Noted Diagnosed Resolved   Supervision of high risk pregnancy, antepartum 08/04/2018 by Tresea Mall, CNM  No   Overview Addendum 11/14/2023  1:18 PM by Burtis Junes, CMA   Clinical Staff Provider  Office Location  [facility] Dating  LMP=7wk [place]  Language  English Anatomy US  Normal  Flu Vaccine  10/7 @ work Genetic Screen  NIPS: low risk, XX  TDaP vaccine   10/21 Hgb A1C or  GTT Early : Third trimester : 80  Covid    LAB RESULTS   Rhogam  B/Positive/-- (07/09 1548)  Blood Type B/Positive/-- (07/09 1548)   RSV 11/14/23 Antibody Negative (07/09 1548)  Feeding Plan  Rubella 1.24 (07/09 1548)  Contraception  RPR Non Reactive (07/09 1548)   Circumcision  HBsAg Negative (07/09 1548)   Pediatrician   HIV Non Reactive (07/09 1548)  Support Person  Varicella 940 (07/09 1548)  Prenatal Classes  GBS  (For PCN allergy, check sensitivities)     Hep C Non Reactive (07/09 1548)   BTL Consent Verbal 10/17/23 (Non-MC) Pap Diagnosis  Date Value Ref Range Status  06/21/2023      - Negative for Intraepithelial Lesions or Malignancy (NILM)  06/21/2023 - Benign reactive/reparative changes      VBAC Consent 10/17/23 Hgb Electro      CF      SMA                     Term labor symptoms  and general obstetric precautions including but not limited to vaginal bleeding, contractions, leaking of fluid and fetal movement were reviewed in detail with the patient. Please refer to After Visit Summary for other counseling recommendations.   Return in about 1 week (around 12/15/2023) for ROB.  Carie Caddy, CNM   [facility]  12/08/23  2:14 PM

## 2023-12-10 LAB — URINE CULTURE: Organism ID, Bacteria: NO GROWTH

## 2023-12-13 ENCOUNTER — Ambulatory Visit
Admission: RE | Admit: 2023-12-13 | Discharge: 2023-12-13 | Disposition: A | Discharge status: Home / Self Care | Payer: Managed Care, Other (non HMO) | Source: Ambulatory Visit | Attending: Licensed Practical Nurse | Admitting: Licensed Practical Nurse

## 2023-12-13 DIAGNOSIS — Z3A37 37 weeks gestation of pregnancy: Secondary | ICD-10-CM | POA: Insufficient documentation

## 2023-12-13 DIAGNOSIS — O26843 Uterine size-date discrepancy, third trimester: Secondary | ICD-10-CM | POA: Diagnosis present

## 2023-12-14 NOTE — L&D Delivery Note (Signed)
Delivery Summary for Cindy Aguilar  Labor Events:   Preterm labor: No data found  Rupture date: 01/04/2024  Rupture time: 9:02 PM  Rupture type: Artificial Intact  Fluid Color: Clear Bloody  Induction: No data found  Augmentation: No data found  Complications: No data found  Cervical ripening: No data found No data found   No data found     Delivery:   Episiotomy: No data found  Lacerations: No data found  Repair suture: No data found  Repair # of packets: No data found  Blood loss (ml): 550   Information for the patient's newborn:  [name], [name] [[id number]]   Delivery 01/05/2024 10:18 AM by  C-Section, Low Transverse Sex:  female Gestational Age: [redacted]w[redacted]d Delivery Clinician:   Living?:         APGARS  One minute Five minutes Ten minutes  Skin color:        Heart rate:        Grimace:        Muscle tone:        Breathing:        Totals: 9  9      Presentation/position:      Resuscitation:   Cord information:    Disposition of cord blood:     Blood gases sent?  Complications:   Placenta: Delivered:       appearance Newborn Measurements: Weight: 9 lb 13 oz (4450 g)  Height: 21"  Head circumference:    Chest circumference:    Other providers:    Additional  information: Forceps:   Vacuum:   Breech:   Observed anomalies       See Dr. Oretha Milch operative note for operative delivery summary.   Dr. Valentino Saxon

## 2023-12-15 ENCOUNTER — Ambulatory Visit (INDEPENDENT_AMBULATORY_CARE_PROVIDER_SITE_OTHER): Payer: Managed Care, Other (non HMO) | Admitting: Licensed Practical Nurse

## 2023-12-15 ENCOUNTER — Encounter: Payer: Self-pay | Admitting: Licensed Practical Nurse

## 2023-12-15 VITALS — BP 126/81 | HR 81 | Wt 176.6 lb

## 2023-12-15 DIAGNOSIS — O0990 Supervision of high risk pregnancy, unspecified, unspecified trimester: Secondary | ICD-10-CM

## 2023-12-15 DIAGNOSIS — Z3A38 38 weeks gestation of pregnancy: Secondary | ICD-10-CM

## 2023-12-15 LAB — POCT URINALYSIS DIPSTICK
Bilirubin, UA: NEGATIVE
Blood, UA: NEGATIVE
Glucose, UA: NEGATIVE
Ketones, UA: NEGATIVE
Nitrite, UA: NEGATIVE
Protein, UA: NEGATIVE
Spec Grav, UA: 1.015 (ref 1.010–1.025)
Urobilinogen, UA: 0.2 U/dL
pH, UA: 7 (ref 5.0–8.0)

## 2023-12-15 NOTE — Progress Notes (Signed)
 Routine Prenatal Care Visit  Subjective  LIBBIE BARTLEY is a 28 y.o. G3P2002 at [redacted]w[redacted]d being seen today for ongoing prenatal care.  She is currently monitored for the following issues for this high-risk pregnancy and has Supervision of high risk pregnancy, antepartum; History of primary cesarean section; and Previous cesarean delivery affecting pregnancy, antepartum on their problem list.  ----------------------------------------------------------------------------------- Patient reports no complaints.  Having B-H contractions. Feels ready for baby -rec sweep at next apt  -had Growth US , re[port not available.   Contractions: Irritability. Vag. Bleeding: None.  Movement: Present. Leaking Fluid denies.  ----------------------------------------------------------------------------------- The following portions of the patient's history were reviewed and updated as appropriate: allergies, current medications, past family history, past medical history, past social history, past surgical history and problem list. Problem list updated.  Objective  Blood pressure 126/81, pulse 81, weight 176 lb 9.6 oz (80.1 kg), last menstrual period 03/23/2023, unknown if currently breastfeeding. Pregravid weight 151 lb (68.5 kg) Total Weight Gain 25 lb 9.6 oz (11.6 kg) Urinalysis: Urine Protein    Urine Glucose    Fetal Status: Fetal Heart Rate (bpm): 140 Fundal Height: 41 cm Movement: Present  Presentation: Vertex  General:  Alert, oriented and cooperative. Patient is in no acute distress.  Skin: Skin is warm and dry. No rash noted.   Cardiovascular: Normal heart rate noted  Respiratory: Normal respiratory effort, no problems with respiration noted  Abdomen: Soft, gravid, appropriate for gestational age. Pain/Pressure: Present     Pelvic:   Dilation: Fingertip Effacement (%): 40 Station: -2  Extremities: Normal range of motion.  Edema: Trace  Mental Status: Normal mood and affect. Normal behavior. Normal judgment  and thought content.   Assessment   28 y.o. H6E7997 at [redacted]w[redacted]d by  12/28/2023, by Last Menstrual Period presenting for routine prenatal visit  Plan   3rd Pregnancy Problems (from 06/21/23 to present)     Problem Noted Diagnosed Resolved   Supervision of high risk pregnancy, antepartum 08/04/2018 by Lynda Bradley, CNM  No   Overview Addendum 12/08/2023  2:15 PM by Delinda Jinnie Jansky, CNM   Clinical Staff Provider  Office Location  [facility] Dating  LMP=7wk US   Language  English Anatomy US   Normal  Flu Vaccine  10/7 @ work Genetic Screen  NIPS: low risk, XX  TDaP vaccine   10/21 Hgb A1C or  GTT Early : Third trimester : 80  Covid    LAB RESULTS   Rhogam  B/Positive/-- (07/09 1548)  Blood Type B/Positive/-- (07/09 1548)   RSV 11/14/23 Antibody Negative (07/09 1548)  Feeding Plan breast Rubella 1.24 (07/09 1548)  Contraception tubal RPR Non Reactive (07/09 1548)   Circumcision NA HBsAg Negative (07/09 1548)   Pediatrician   HIV Non Reactive (07/09 1548)  Support Person Partner  Varicella 940 (07/09 1548)  Prenatal Classes  GBS  (For PCN allergy, check sensitivities)     Hep C Non Reactive (07/09 1548)   BTL Consent Verbal 10/17/23 (Non-MC) Pap Diagnosis  Date Value Ref Range Status  06/21/2023      - Negative for Intraepithelial Lesions or Malignancy (NILM)  06/21/2023 - Benign reactive/reparative changes      VBAC Consent 10/17/23 Hgb Electro      CF      SMA                     Term labor symptoms and general obstetric precautions including but not limited to vaginal bleeding, contractions, leaking of fluid  and fetal movement were reviewed in detail with the patient. Please refer to After Visit Summary for other counseling recommendations.   Return in about 1 week (around 12/22/2023) for ROB.  Jinnie Cookey, CNM  [facility]  12/15/23  1:35 PM

## 2023-12-15 NOTE — Addendum Note (Signed)
 Addended by: Burtis Junes on: 12/15/2023 01:44 PM   Modules accepted: Orders

## 2023-12-17 LAB — URINE CULTURE

## 2023-12-22 ENCOUNTER — Ambulatory Visit (INDEPENDENT_AMBULATORY_CARE_PROVIDER_SITE_OTHER): Payer: Managed Care, Other (non HMO)

## 2023-12-22 VITALS — BP 134/89 | HR 77 | Wt 181.0 lb

## 2023-12-22 DIAGNOSIS — Z3A39 39 weeks gestation of pregnancy: Secondary | ICD-10-CM

## 2023-12-22 DIAGNOSIS — O0990 Supervision of high risk pregnancy, unspecified, unspecified trimester: Secondary | ICD-10-CM

## 2023-12-22 DIAGNOSIS — O34219 Maternal care for unspecified type scar from previous cesarean delivery: Secondary | ICD-10-CM

## 2023-12-22 DIAGNOSIS — Z98891 History of uterine scar from previous surgery: Secondary | ICD-10-CM

## 2023-12-22 NOTE — Assessment & Plan Note (Addendum)
-   Will plan to schedule postdates induction at next appointment if still pregnant. Plan for NST with next visit and BPP for later in week.  - SVE FTP/30/ballotable, desires membrane sweep at next visit.  - Reviewed labor warning signs and expectations for birth. Instructed to call office or come to hospital with persistent headache, vision changes, regular contractions, leaking of fluid, decreased fetal movement or vaginal bleeding.

## 2023-12-22 NOTE — Progress Notes (Signed)
    Return Prenatal Note   Assessment/Plan   Plan  28 y.o. [id number] at [redacted]w[redacted]d presents for follow-up OB visit. Reviewed prenatal record including previous visit note.  Supervision of high risk pregnancy, antepartum - Will plan to schedule postdates induction at next appointment if still pregnant. Plan for NST with next visit and BPP for later in week.  - SVE FTP/30/ballotable, desires membrane sweep at next visit.  - Reviewed labor warning signs and expectations for birth. Instructed to call office or come to hospital with persistent headache, vision changes, regular contractions, leaking of fluid, decreased fetal movement or vaginal bleeding.    Orders Placed This Encounter  Procedures   US  FETAL BPP WO NON STRESS    Standing Status:   Future    Expected Date:   12/26/2023    Expiration Date:   12/21/2024    Reason for Exam (SYMPTOM  OR DIAGNOSIS REQUIRED):   postdates    Preferred Imaging Location?:   Internal   Return in about 1 week (around 12/29/2023) for ROB with NST.   No future appointments.  For next visit:  NST with BPP following week     Subjective   28 y.o. [id number] at [redacted]w[redacted]d presents for this follow-up prenatal visit.  Patient ready for baby. Has had some contractions.  Patient reports: Movement: Present Contractions: Irregular  Objective   Flow sheet Vitals: Pulse Rate: 77 BP: 134/89 Fundal Height: 39 cm Fetal Heart Rate (bpm): 130 Presentation: Vertex Dilation: Fingertip Effacement (%): 30 Station: Ballotable Total weight gain: 30 lb (13.6 kg)  General Appearance  No acute distress, well appearing, and well nourished Pulmonary   Normal work of breathing Neurologic   Alert and oriented to person, place, and time Psychiatric   Mood and affect within normal limits  Lauraine PARAS Maxi Rodas, CNM  12/21/2508:17 AM

## 2023-12-26 ENCOUNTER — Other Ambulatory Visit: Payer: Managed Care, Other (non HMO)

## 2023-12-28 ENCOUNTER — Other Ambulatory Visit: Payer: Managed Care, Other (non HMO)

## 2023-12-28 ENCOUNTER — Encounter: Payer: Managed Care, Other (non HMO) | Admitting: Licensed Practical Nurse

## 2023-12-29 ENCOUNTER — Ambulatory Visit (INDEPENDENT_AMBULATORY_CARE_PROVIDER_SITE_OTHER): Payer: Managed Care, Other (non HMO)

## 2023-12-29 ENCOUNTER — Other Ambulatory Visit: Payer: Self-pay

## 2023-12-29 ENCOUNTER — Ambulatory Visit: Payer: Managed Care, Other (non HMO) | Admitting: Licensed Practical Nurse

## 2023-12-29 VITALS — BP 130/81 | HR 86 | Wt 183.0 lb

## 2023-12-29 VITALS — BP 130/81 | HR 86 | Ht 63.0 in | Wt 183.0 lb

## 2023-12-29 DIAGNOSIS — Z3A40 40 weeks gestation of pregnancy: Secondary | ICD-10-CM | POA: Insufficient documentation

## 2023-12-29 DIAGNOSIS — O34219 Maternal care for unspecified type scar from previous cesarean delivery: Secondary | ICD-10-CM

## 2023-12-29 DIAGNOSIS — O480 Post-term pregnancy: Secondary | ICD-10-CM | POA: Diagnosis not present

## 2023-12-29 DIAGNOSIS — Z3A39 39 weeks gestation of pregnancy: Secondary | ICD-10-CM

## 2023-12-29 DIAGNOSIS — Z98891 History of uterine scar from previous surgery: Secondary | ICD-10-CM

## 2023-12-29 DIAGNOSIS — O0990 Supervision of high risk pregnancy, unspecified, unspecified trimester: Secondary | ICD-10-CM

## 2023-12-29 NOTE — Progress Notes (Signed)
    Return Prenatal Note   Assessment/Plan   Plan  28 y.o. [id number] at [redacted]w[redacted]d presents for follow-up OB visit. Reviewed prenatal record including previous visit note.  Supervision of high risk pregnancy, antepartum - Reactive NST today in clinic. BPP scheduled for 1/20.  - Postdates IOL scheduled for 0500 on 1/22. SVE today 0.5/th/high. - Reviewed labor warning signs and expectations for birth. Instructed to call office or come to hospital with persistent headache, vision changes, regular contractions, leaking of fluid, decreased fetal movement or vaginal bleeding.   No orders of the defined types were placed in this encounter.  No follow-ups on file.   Future Appointments  Date Time Provider Department Center  01/02/2024 10:15 AM [facility] [place] 1 [facility] None  01/02/2024 11:15 AM Glenetta Borg, CNM [facility] None    For next visit:  continue with routine prenatal care     Subjective   28 y.o. [id number] at [redacted]w[redacted]d presents for this follow-up prenatal visit.  Patient ready for baby.  Patient reports: Movement: Present Contractions: Irregular  Objective   Flow sheet Vitals: Pulse Rate: 86 BP: 130/81 Fundal Height: 40 cm Fetal Heart Rate (bpm): RNST Presentation: Vertex Dilation: Fingertip Effacement (%): Thick Station: Ballotable Total weight gain: 32 lb (14.5 kg)  General Appearance  No acute distress, well appearing, and well nourished Pulmonary   Normal work of breathing Neurologic   Alert and oriented to person, place, and time Psychiatric   Mood and affect within normal limits  Lindalou Hose Jeovani Weisenburger, CNM  01/16/252:04 PM

## 2023-12-29 NOTE — Patient Instructions (Signed)

## 2023-12-29 NOTE — Progress Notes (Signed)
    NURSE VISIT NOTE  Subjective:    Patient ID: Cindy Aguilar, female    DOB: 03-03-1996, 28 y.o.   MRN: [id number]  HPI  Patient is a 28 y.o. G3P2002 female who presents for fetal monitoring per order from Autumn Messing, [place].   Objective:    BP 130/81   Pulse 86   Ht 5\' 3"  (1.6 m)   Wt 183 lb (83 kg)   LMP 03/23/2023   BMI 32.42 kg/m  Estimated Date of Delivery: 12/28/23  [redacted]w[redacted]d  Fetus A Non-Stress Test Interpretation for 12/29/23  Indication: Post Dates  Fetal Heart Rate A Mode: External Baseline Rate (A): 135 bpm Variability: Moderate Accelerations: 15 x 15 Decelerations: None Multiple birth?: No  Uterine Activity Mode: Toco Contraction Frequency (min): None  Interpretation (Fetal Testing) Nonstress Test Interpretation: Reactive Overall Impression: Reassuring for gestational age   Assessment:   1. Post-term pregnancy, 40-42 weeks of gestation   2. [redacted] weeks gestation of pregnancy      Plan:   Results reviewed and discussed with patient by  Autumn Messing, CNM.     Rocco Serene, LPN

## 2023-12-29 NOTE — Assessment & Plan Note (Signed)
-   Reactive NST today in clinic. BPP scheduled for 1/20.  - Postdates IOL scheduled for 0500 on 1/22. SVE today 0.5/th/high. - Reviewed labor warning signs and expectations for birth. Instructed to call office or come to hospital with persistent headache, vision changes, regular contractions, leaking of fluid, decreased fetal movement or vaginal bleeding.

## 2024-01-02 ENCOUNTER — Encounter: Payer: Self-pay | Admitting: Obstetrics

## 2024-01-02 ENCOUNTER — Ambulatory Visit: Payer: Managed Care, Other (non HMO)

## 2024-01-02 ENCOUNTER — Ambulatory Visit (INDEPENDENT_AMBULATORY_CARE_PROVIDER_SITE_OTHER): Payer: Managed Care, Other (non HMO) | Admitting: Obstetrics

## 2024-01-02 VITALS — BP 130/88 | HR 75 | Wt 183.0 lb

## 2024-01-02 DIAGNOSIS — O0990 Supervision of high risk pregnancy, unspecified, unspecified trimester: Secondary | ICD-10-CM

## 2024-01-02 DIAGNOSIS — O480 Post-term pregnancy: Secondary | ICD-10-CM

## 2024-01-02 DIAGNOSIS — Z3A40 40 weeks gestation of pregnancy: Secondary | ICD-10-CM | POA: Diagnosis not present

## 2024-01-02 NOTE — Progress Notes (Signed)
    Return Prenatal Note   Assessment/Plan   Plan  28 y.o. [id number] at [redacted]w[redacted]d presents for follow-up OB visit. Reviewed prenatal record including previous visit note.  Supervision of high risk pregnancy, antepartum -BPP 8/8 -Reviewed IOL methods and where/when to go -Dicussed s/s of labor and when to go to the hospital -IOL 01/04/24 @ 0500   No orders of the defined types were placed in this encounter.  No follow-ups on file.   No future appointments.  For next visit:   IOL    Subjective   Zeanna is ready for baby! Declines cervical exam today.  Movement: Present Contractions: Irritability  Objective   Flow sheet Vitals: Pulse Rate: 75 BP: 130/88 Fundal Height: 41 cm Fetal Heart Rate (bpm): 147 Total weight gain: 32 lb (14.5 kg)  General Appearance  No acute distress, well appearing, and well nourished Pulmonary   Normal work of breathing Neurologic   Alert and oriented to person, place, and time Psychiatric   Mood and affect within normal limits  Guadlupe Spanish, CNM 01/02/24 11:39 AM

## 2024-01-02 NOTE — Assessment & Plan Note (Signed)
-  BPP 8/8 -Reviewed IOL methods and where/when to go -Dicussed s/s of labor and when to go to the hospital -IOL 01/04/24 @ 0500

## 2024-01-04 ENCOUNTER — Inpatient Hospital Stay: Payer: Managed Care, Other (non HMO) | Admitting: General Practice

## 2024-01-04 ENCOUNTER — Inpatient Hospital Stay
Admission: EM | Admit: 2024-01-04 | Discharge: 2024-01-07 | DRG: 784 | Disposition: A | Discharge status: Home / Self Care | Payer: Managed Care, Other (non HMO) | Attending: Certified Nurse Midwife | Admitting: Certified Nurse Midwife

## 2024-01-04 ENCOUNTER — Other Ambulatory Visit: Payer: Self-pay

## 2024-01-04 ENCOUNTER — Encounter: Payer: Self-pay | Admitting: Obstetrics

## 2024-01-04 DIAGNOSIS — O480 Post-term pregnancy: Principal | ICD-10-CM | POA: Diagnosis present

## 2024-01-04 DIAGNOSIS — O99820 Streptococcus B carrier state complicating pregnancy: Secondary | ICD-10-CM | POA: Diagnosis not present

## 2024-01-04 DIAGNOSIS — D62 Acute posthemorrhagic anemia: Secondary | ICD-10-CM | POA: Diagnosis not present

## 2024-01-04 DIAGNOSIS — O0990 Supervision of high risk pregnancy, unspecified, unspecified trimester: Secondary | ICD-10-CM

## 2024-01-04 DIAGNOSIS — Z3A41 41 weeks gestation of pregnancy: Secondary | ICD-10-CM | POA: Diagnosis not present

## 2024-01-04 DIAGNOSIS — O34211 Maternal care for low transverse scar from previous cesarean delivery: Secondary | ICD-10-CM | POA: Diagnosis present

## 2024-01-04 DIAGNOSIS — Z833 Family history of diabetes mellitus: Secondary | ICD-10-CM

## 2024-01-04 DIAGNOSIS — Z3202 Encounter for pregnancy test, result negative: Secondary | ICD-10-CM | POA: Diagnosis not present

## 2024-01-04 DIAGNOSIS — Z302 Encounter for sterilization: Secondary | ICD-10-CM | POA: Diagnosis not present

## 2024-01-04 DIAGNOSIS — O34219 Maternal care for unspecified type scar from previous cesarean delivery: Secondary | ICD-10-CM | POA: Diagnosis present

## 2024-01-04 DIAGNOSIS — O339 Maternal care for disproportion, unspecified: Secondary | ICD-10-CM | POA: Diagnosis present

## 2024-01-04 DIAGNOSIS — O9081 Anemia of the puerperium: Secondary | ICD-10-CM | POA: Diagnosis not present

## 2024-01-04 LAB — CBC
HCT: 33.7 % — ABNORMAL LOW (ref 36.0–46.0)
Hemoglobin: 10.6 g/dL — ABNORMAL LOW (ref 12.0–15.0)
MCH: 23 pg — ABNORMAL LOW (ref 26.0–34.0)
MCHC: 31.5 g/dL (ref 30.0–36.0)
MCV: 73.1 fL — ABNORMAL LOW (ref 80.0–100.0)
Platelets: 335 10*3/uL (ref 150–400)
RBC: 4.61 MIL/uL (ref 3.87–5.11)
RDW: 17.8 % — ABNORMAL HIGH (ref 11.5–15.5)
WBC: 10.7 10*3/uL — ABNORMAL HIGH (ref 4.0–10.5)
nRBC: 0.6 % — ABNORMAL HIGH (ref 0.0–0.2)

## 2024-01-04 LAB — RPR: RPR Ser Ql: NONREACTIVE

## 2024-01-04 MED ORDER — EPHEDRINE 5 MG/ML INJ: 10.0000 mg | INTRAVENOUS | Status: DC | PRN

## 2024-01-04 MED ORDER — ZOLPIDEM TARTRATE 5 MG PO TABS: 5.0000 mg | ORAL_TABLET | Freq: Every evening | ORAL | Status: DC | PRN

## 2024-01-04 MED ORDER — ACETAMINOPHEN 325 MG PO TABS
650.0000 mg | ORAL_TABLET | ORAL | Status: DC | PRN
Start: 2024-01-04 — End: 2024-01-05

## 2024-01-04 MED ORDER — FENTANYL-BUPIVACAINE-NACL 0.5-0.125-0.9 MG/250ML-% EP SOLN
12.0000 mL/h | EPIDURAL | Status: DC | PRN
  Administered 2024-01-04: 12 mL/h via EPIDURAL
  Filled 2024-01-04: qty 250

## 2024-01-04 MED ORDER — SODIUM CHLORIDE 0.9 % IV SOLN
5.0000 10*6.[IU] | Freq: Once | INTRAVENOUS | Status: AC
  Administered 2024-01-04: 5 10*6.[IU] via INTRAVENOUS
  Filled 2024-01-04: qty 5

## 2024-01-04 MED ORDER — HYDROXYZINE HCL 25 MG PO TABS: 50.0000 mg | ORAL_TABLET | Freq: Four times a day (QID) | ORAL | Status: DC | PRN

## 2024-01-04 MED ORDER — PHENYLEPHRINE 80 MCG/ML (10ML) SYRINGE FOR IV PUSH (FOR BLOOD PRESSURE SUPPORT)
80.0000 ug | PREFILLED_SYRINGE | INTRAVENOUS | Status: DC | PRN
  Administered 2024-01-05: 80 ug via INTRAVENOUS

## 2024-01-04 MED ORDER — DIPHENHYDRAMINE HCL 50 MG/ML IJ SOLN: 12.5000 mg | INTRAMUSCULAR | Status: DC | PRN

## 2024-01-04 MED ORDER — LIDOCAINE-EPINEPHRINE (PF) 1.5 %-1:200000 IJ SOLN
INTRAMUSCULAR | Status: DC | PRN
  Administered 2024-01-04: 3 mL via PERINEURAL

## 2024-01-04 MED ORDER — TERBUTALINE SULFATE 1 MG/ML IJ SOLN: 0.2500 mg | Freq: Once | INTRAMUSCULAR | Status: DC | PRN

## 2024-01-04 MED ORDER — ONDANSETRON HCL 4 MG/2ML IJ SOLN
4.0000 mg | Freq: Four times a day (QID) | INTRAMUSCULAR | Status: DC | PRN
  Administered 2024-01-05: 4 mg via INTRAVENOUS
  Filled 2024-01-04: qty 2

## 2024-01-04 MED ORDER — FAMOTIDINE 20 MG PO TABS
20.0000 mg | ORAL_TABLET | Freq: Two times a day (BID) | ORAL | Status: DC
Start: 2024-01-04 — End: 2024-01-05
  Administered 2024-01-04 – 2024-01-05 (×3): 20 mg via ORAL
  Filled 2024-01-04 (×3): qty 1

## 2024-01-04 MED ORDER — PENICILLIN G POT IN DEXTROSE 60000 UNIT/ML IV SOLN
3.0000 10*6.[IU] | INTRAVENOUS | Status: DC
  Administered 2024-01-04 – 2024-01-05 (×3): 3 10*6.[IU] via INTRAVENOUS
  Filled 2024-01-04 (×3): qty 50

## 2024-01-04 MED ORDER — LACTATED RINGERS IV SOLN
500.0000 mL | INTRAVENOUS | Status: AC | PRN
  Administered 2024-01-04: 500 mL via INTRAVENOUS

## 2024-01-04 MED ORDER — SODIUM CHLORIDE 0.9% FLUSH: 3.0000 mL | INTRAVENOUS | Status: DC | PRN

## 2024-01-04 MED ORDER — LIDOCAINE HCL (PF) 1 % IJ SOLN: 30.0000 mL | INTRAMUSCULAR | Status: DC | PRN

## 2024-01-04 MED ORDER — MISOPROSTOL 200 MCG PO TABS
ORAL_TABLET | ORAL | Status: AC
  Filled 2024-01-04: qty 4

## 2024-01-04 MED ORDER — OXYTOCIN-SODIUM CHLORIDE 30-0.9 UT/500ML-% IV SOLN: 1.0000 m[IU]/min | INTRAVENOUS | Status: DC

## 2024-01-04 MED ORDER — CALCIUM CARBONATE ANTACID 500 MG PO CHEW
2.0000 | CHEWABLE_TABLET | ORAL | Status: DC | PRN
  Administered 2024-01-05 (×2): 400 mg via ORAL
  Filled 2024-01-04 (×3): qty 2

## 2024-01-04 MED ORDER — SODIUM CHLORIDE 0.9% FLUSH: 3.0000 mL | Freq: Two times a day (BID) | INTRAVENOUS | Status: DC

## 2024-01-04 MED ORDER — OXYTOCIN 10 UNIT/ML IJ SOLN
INTRAMUSCULAR | Status: AC
  Filled 2024-01-04: qty 2

## 2024-01-04 MED ORDER — FENTANYL CITRATE (PF) 100 MCG/2ML IJ SOLN
50.0000 ug | INTRAMUSCULAR | Status: DC | PRN
Start: 2024-01-04 — End: 2024-01-05

## 2024-01-04 MED ORDER — OXYTOCIN-SODIUM CHLORIDE 30-0.9 UT/500ML-% IV SOLN
2.5000 [IU]/h | INTRAVENOUS | Status: DC
  Administered 2024-01-05: 600 m[IU]/h via INTRAVENOUS
  Filled 2024-01-04 (×2): qty 500

## 2024-01-04 MED ORDER — PHENYLEPHRINE 80 MCG/ML (10ML) SYRINGE FOR IV PUSH (FOR BLOOD PRESSURE SUPPORT): 80.0000 ug | PREFILLED_SYRINGE | INTRAVENOUS | Status: DC | PRN

## 2024-01-04 MED ORDER — LACTATED RINGERS IV SOLN: INTRAVENOUS | Status: AC

## 2024-01-04 MED ORDER — SODIUM CHLORIDE 0.9 % IV SOLN
INTRAVENOUS | Status: DC | PRN
  Administered 2024-01-04: 4 mL via EPIDURAL

## 2024-01-04 MED ORDER — LIDOCAINE HCL (PF) 1 % IJ SOLN
INTRAMUSCULAR | Status: AC
  Filled 2024-01-04: qty 30

## 2024-01-04 MED ORDER — OXYTOCIN BOLUS FROM INFUSION: 333.0000 mL | Freq: Once | INTRAVENOUS | Status: DC

## 2024-01-04 MED ORDER — AMMONIA AROMATIC IN INHA
RESPIRATORY_TRACT | Status: AC
  Filled 2024-01-04: qty 10

## 2024-01-04 NOTE — Progress Notes (Signed)
Cindy Aguilar is a 28 y.o. G3P2002 at [redacted]w[redacted]d by LMP admitted for induction of labor due to Post dates. Due date 12/28/2023 and TOLAC.  Subjective: Cindy Aguilar is coping well with contractions. On birth ball at this time with family at bedside.   Objective: BP 127/76 (BP Location: Left Arm)   Pulse 79   Temp 98.1 F (36.7 C) (Oral)   Resp 16   LMP 03/23/2023  No intake/output data recorded. No intake/output data recorded.  FHT:  FHR: 135 bpm, variability: moderate,  accelerations:  Present,  decelerations:  Absent UC:   irregular, every 1.5-4 minutes SVE:   Dilation: 1 Effacement (%): 50 Station: -2 Exam by:: Quitman Livings, CNM  Labs: Lab Results  Component Value Date   WBC 10.7 (H) 01/04/2024   HGB 10.6 (L) 01/04/2024   HCT 33.7 (L) 01/04/2024   MCV 73.1 (L) 01/04/2024   PLT 335 01/04/2024    Assessment / Plan: Induction of labor due to postterm and TOLAC,  progressing well on pitocin, and Cook catheter remains in place.  Continue continuous fetal monitoring.  Continue to uptitrate pitocin per protocol.   Labor:  Cindy Aguilar catheter still in place with low dose pitocin.  Preeclampsia:   n/a Fetal Wellbeing:  Category I Pain Control:  Labor support without medications and plan for epidural as labor intensifies.  I/D:  n/a Anticipated MOD:   VBAC  Cindy Aguilar, Student-MidWife 01/04/2024, 9:47 AM Carie Caddy, CNM present for all portions of care.

## 2024-01-04 NOTE — Progress Notes (Signed)
Cindy Aguilar is a 28 y.o. G3P2002 at [redacted]w[redacted]d by LMP admitted for induction of labor due to postdates in the setting of TOLAC.  Subjective: Resting comfortably with epidural in place. Family at bedside for support.   Objective: BP 105/60   Pulse 77   Temp 98.1 F (36.7 C) (Oral)   Resp 18   Ht 5\' 3"  (1.6 m)   Wt 83 kg   LMP 03/23/2023   SpO2 100%   BMI 32.41 kg/m  I/O last 3 completed shifts: In: 240 [P.O.:240] Out: -  No intake/output data recorded.  FHT:  FHR: 135 bpm, variability: moderate,  accelerations:  Present,  decelerations:  Absent UC:   regular, every 2-4 minutes SVE:   Dilation: 5.5 Effacement (%): 50 Station: -2 Exam by:: Dominic, CNM  Labs: Lab Results  Component Value Date   WBC 10.7 (H) 01/04/2024   HGB 10.6 (L) 01/04/2024   HCT 33.7 (L) 01/04/2024   MCV 73.1 (L) 01/04/2024   PLT 335 01/04/2024    Assessment / Plan: Induction of labor at 41 weeks due to postdates in the setting of TOLAC.  AROM performed; clear fluid.  Continue titrating pitocin per protocol.  Continue continuous fetal monitoring.   Labor:  progressing normally on pitocin Preeclampsia:   n/a Fetal Wellbeing:  Category I Pain Control:  Epidural I/D:  n/a Anticipated MOD:   VBAC  Eloise Levels, Student-MidWife 01/04/2024, 9:40 PM  Carie Caddy, CNM present for all portions of care.

## 2024-01-04 NOTE — H&P (Signed)
[facility] Labor & Delivery  History and Physical  ASSESSMENT AND PLAN   Cindy Aguilar is a 28 y.o. [id number] at [redacted]w[redacted]d with EDD: 12/28/2023, by Last Menstrual Period admitted for postdates IOL/TOLAC.  1. Admit to L&D for admission 2. EFM: Continuous -- Category 1 3. Cervical ripening with Cook catheter and Pitocin 4. Admission labs  5. Pharmacologic pain relief if desired.   6. Anticipate NSVD 7. Dr. Lonny Prude and anesthesia notified of admission  Fetal Status: - cephalic presentation by Leopold's, SVE, and [place] on 01/02/24. - EFW: 7.5 lbs by Leopold's - FHT currently category 1   Labs/Immunizations: Prenatal labs and studies: ABO, Rh: --/--/PENDING (01/22 0556) Antibody: PENDING (01/22 0556) Rubella: 1.24 (07/09 1548) Varicella: 940 (07/09 1548)  RPR: Non Reactive (10/21 1007)  HBsAg: Negative (07/09 1548)  HepC: Non Reactive (07/09 1548) HIV: Non Reactive (10/21 1007)  GBS: Positive/-- (12/16 1342)   TDAP: 10/03/23 Flu: no RSV: 11/14/23  Postpartum Plan: - Feeding: breast - Contraception: plans Postpartum BTL - Prenatal Care Provider: AOB   HPI   Chief Complaint: Induction of Labor  Cindy Aguilar is a 28 y.o. [id number] at [redacted]w[redacted]d who presents for postdates IOL. She has a h/o of 2 prior CS. She desires a BTL postpartum. Her pregnancy has been complicated by mild right UTD in the fetus which will be followed by peds. Cindy Aguilar has been having mild contractions this morning. She denies LOF and vaginal bleeding. She endorses good fetal movement.   Pregnancy Complications Patient Active Problem List   Diagnosis Date Noted   Labor and delivery, indication for care 01/04/2024   Post-term pregnancy, 40-42 weeks of gestation 12/29/2023   Previous cesarean delivery affecting pregnancy, antepartum 03/27/2019   Supervision of high risk pregnancy, antepartum 08/04/2018    Review of Systems A twelve point review of systems was negative except as stated in HPI.    HISTORY   Medications Medications Prior to Admission  Medication Sig Dispense Refill Last Dose/Taking   Ferrous Sulfate (IRON PO) Take 1 tablet by mouth daily.      ondansetron (ZOFRAN-ODT) 4 MG disintegrating tablet Take 1 tablet (4 mg total) by mouth every 8 (eight) hours as needed for nausea or vomiting. 20 tablet 1     Allergies has no known allergies.   OB History OB History  Gravida Para Term Preterm AB Living  3 2 2  0 0 2  Cindy Aguilar  0 0 0 0 2    # Outcome Date GA Lbr Len/2nd Weight Sex Type Anes PTL Lv  3 Current           2 Term 03/27/19 [redacted]w[redacted]d  3940 g F CS-LTranv Spinal  LIV     Name: Cindy Aguilar     Apgar1: 9  Apgar5: 9  1 Term 07/21/14 [redacted]w[redacted]d  3402 g F CS-LTranv   LIV     Complications: Failure to Progress in First Stage, Intraamniotic Infection     Cindy Aguilar    Past Medical History Past Medical History:  Diagnosis Date   Asthma    well controlled   Depression    no meds   History of primary cesarean section 09/15/2018   Iron deficiency anemia of pregnancy 2015   PCOS (polycystic ovarian syndrome)    LEFT OVARY ENLARGED    Past Surgical History Past Surgical History:  Procedure Laterality Date   CESAREAN SECTION  2015   CESAREAN SECTION N/A 03/27/2019  Procedure: CESAREAN SECTION;  Surgeon: Conard Novak, MD;  Location: [facility] ORS;  Service: Obstetrics;  Laterality: N/A;    Social History  reports that she has never smoked. She has never used smokeless tobacco. She reports that she does not drink alcohol and does not use drugs.   Family History family history includes Cancer (age of onset: 63) in her father; Diabetes in her maternal uncle and maternal uncle; Heart Problems in her maternal uncle.   PHYSICAL EXAM   Vitals:   01/04/24 0602  BP: (!) 129/92  Pulse: 81  Temp: 98.6 F (37 C)  TempSrc: Oral    Constitutional: No acute distress, well appearing, and well nourished. Neurologic: She is alert  and conversational.  Psychiatric: She has a normal mood and affect.  Musculoskeletal: Normal gait, grossly normal range of motion Cardiovascular: Normal rate.   Pulmonary/Chest: Normal work of breathing.  Gastrointestinal/Abdominal: Soft. Gravid. There is no tenderness.  Skin: Skin is warm and dry. No rash noted.  Genitourinary: Normal external female genitalia.   SVE:   Dilation: 1 Effacement (%): 50 Cervical Position: Middle Station: -2 Presentation: Vertex Exam by:: Quitman Livings, CNM   NST Interpretation  Baseline: 135 bpm Variability: moderate Accelerations: present Decelerations: none Contractions: irregular, every 2-4 minutes Impression: reactive   Glenetta Borg, CNM

## 2024-01-04 NOTE — Progress Notes (Signed)
Cindy Aguilar is a 28 y.o. G3P2002 at [redacted]w[redacted]d by LMP admitted for induction of labor due to post-dates in the setting of TOLAC.  Subjective: Contractions are intensifying. Cindy Aguilar continues to cope well with them, and will let us know when she is ready for her epidural.  Resting in bed after being up on the birth ball for a couple of hours.   Objective: BP 131/80 (BP Location: Left Arm)   Pulse 84   Temp 98.2 F (36.8 C) (Oral)   Resp 16   Ht 5\' 3"  (1.6 m)   Wt 83 kg   LMP 03/23/2023   BMI 32.41 kg/m  No intake/output data recorded. Total I/O In: 240 [P.O.:240] Out: -   FHT:  FHR: 135 bpm, variability: moderate,  accelerations:  Present,  decelerations:  Absent UC:   regular, every 2-3 minutes SVE:   Dilation: 5 Effacement (%): 50 Station: -3 Exam by:: Dominic, CNM  Labs: Lab Results  Component Value Date   WBC 10.7 (H) 01/04/2024   HGB 10.6 (L) 01/04/2024   HCT 33.7 (L) 01/04/2024   MCV 73.1 (L) 01/04/2024   PLT 335 01/04/2024    Assessment / Plan: Induced labor progressing well.  Continue to uptitrate pitocin per protocol.  Labor: Progressing on Pitocin, will continue to increase then AROM Preeclampsia:   n/a Fetal Wellbeing:  Category I Pain Control:  Labor support without medications, epidural when ready I/D:  n/a Anticipated MOD:   VBAC  Eloise Levels, Student-MidWife 01/04/2024, 4:45 PM Carie Caddy, CNM present for all portions of care.

## 2024-01-04 NOTE — Progress Notes (Addendum)
Cindy Aguilar is a 28 y.o. G3P2002 at [redacted]w[redacted]d by LMP admitted for induction of labor due to Post dates. Due date 12/28/2023 and TOLAC.  Subjective: Up ad-lib around the room, coping well with contractions.  Family at bedside for support.  Objective: BP 127/76 (BP Location: Left Arm)   Pulse 79   Temp 98.1 F (36.7 C) (Oral)   Resp 16   Ht 5\' 3"  (1.6 m)   Wt 83 kg   LMP 03/23/2023   BMI 32.41 kg/m  No intake/output data recorded. No intake/output data recorded.  FHT:  FHR: 125 bpm, variability: moderate,  accelerations:  Present,  decelerations:  Absent UC:   irregular, every 1.5-3 minutes SVE:   Dilation: 3 Effacement (%): 50 Station: Ballotable Exam by:: [employer] CNM  Labs: Lab Results  Component Value Date   WBC 10.7 (H) 01/04/2024   HGB 10.6 (L) 01/04/2024   HCT 33.7 (L) 01/04/2024   MCV 73.1 (L) 01/04/2024   PLT 335 01/04/2024    Assessment / Plan: Induction of labor due to postterm,  progressing well on pitocin Cook catheter out.  Continue to uptitrate pitocin until safe to AROM.  Applied belly binder for support.  Labor: Progressing on Pitocin, will continue to increase then AROM Preeclampsia:   n/a Fetal Wellbeing:  Category I Pain Control:  Labor support without medications I/D:  n/a Anticipated MOD:   VBAC  Cindy Aguilar, Student-MidWife 01/04/2024, 1:50 PM  Carie Caddy, CNM present for all portions of care.

## 2024-01-04 NOTE — Anesthesia Preprocedure Evaluation (Signed)
Anesthesia Evaluation  Patient identified by MRN, date of birth, ID band Patient awake    Reviewed: Allergy & Precautions, NPO status , Patient's Chart, lab work & pertinent test results  Airway Mallampati: III  TM Distance: >3 FB Neck ROM: full    Dental  (+) Chipped, Dental Advidsory Given   Pulmonary asthma    Pulmonary exam normal        Cardiovascular Exercise Tolerance: Good negative cardio ROS Normal cardiovascular exam     Neuro/Psych  PSYCHIATRIC DISORDERS         GI/Hepatic negative GI ROS,,,  Endo/Other    Renal/GU   negative genitourinary   Musculoskeletal   Abdominal   Peds  Hematology negative hematology ROS (+)   Anesthesia Other Findings Hx of failed epidural.  Past Medical History: No date: Asthma     Comment:  well controlled No date: Depression     Comment:  no meds 09/15/2018: History of primary cesarean section 2015: Iron deficiency anemia of pregnancy No date: PCOS (polycystic ovarian syndrome)     Comment:  LEFT OVARY ENLARGED  Past Surgical History: 2015: CESAREAN SECTION 03/27/2019: CESAREAN SECTION; N/A     Comment:  Procedure: CESAREAN SECTION;  Surgeon: Conard Novak, MD;  Location: [facility] ORS;  Service: Obstetrics;                Laterality: N/A;  BMI    Body Mass Index: 32.41 kg/m      Reproductive/Obstetrics (+) Pregnancy                             Anesthesia Physical Anesthesia Plan  ASA: 2  Anesthesia Plan: Epidural   Post-op Pain Management:    Induction:   PONV Risk Score and Plan:   Airway Management Planned: Natural Airway  Additional Equipment:   Intra-op Plan:   Post-operative Plan:   Informed Consent: I have reviewed the patients History and Physical, chart, labs and discussed the procedure including the risks, benefits and alternatives for the proposed anesthesia with the patient or authorized  representative who has indicated his/her understanding and acceptance.     Dental Advisory Given  Plan Discussed with: Anesthesiologist  Anesthesia Plan Comments: (Patient reports no bleeding problems and no anticoagulant use.   Patient consented for risks of anesthesia including but not limited to:  - adverse reactions to medications - risk of bleeding, infection and or nerve damage from epidural that could lead to paralysis - risk of headache or failed epidural - nerve damage due to positioning - that if epidural is used for C-section that there is a chance of epidural failure requiring spinal placement or conversion to GA - Damage to heart, brain, lungs, other parts of body or loss of life  Patient voiced understanding and assent.)       Anesthesia Quick Evaluation

## 2024-01-04 NOTE — Anesthesia Procedure Notes (Signed)
Epidural Patient location during procedure: OB Start time: 01/04/2024 7:40 PM End time: 01/04/2024 8:04 PM  Staffing Anesthesiologist: Stephanie Coup, MD Performed: anesthesiologist   Preanesthetic Checklist Completed: patient identified, IV checked, site marked, risks and benefits discussed, surgical consent, monitors and equipment checked, pre-op evaluation and timeout performed  Epidural Patient position: sitting Prep: Betadine Patient monitoring: heart rate, continuous pulse ox and blood pressure Approach: midline Location: L3-L4 Injection technique: LOR saline  Needle:  Needle type: Tuohy  Needle gauge: 18 G Needle length: 9 cm and 9 Needle insertion depth: 6 cm Catheter type: closed end flexible Catheter size: 20 Guage Catheter at skin depth: 11 cm Test dose: negative and 1.5% lidocaine with Epi 1:200 K  Assessment Sensory level: T4 Events: blood not aspirated, no cerebrospinal fluid, injection not painful, no injection resistance, no paresthesia and negative IV test  Additional Notes Patient with hx of failed epidural in the past. Patient had significant scoliosis and epidural was difficult to place.  2 attempts Pt. Evaluated and documentation done after procedure finished. Patient identified. Risks/Benefits/Options discussed with patient including but not limited to bleeding, infection, nerve damage, paralysis, failed block, incomplete pain control, headache, blood pressure changes, nausea, vomiting, reactions to medication both or allergic, itching and postpartum back pain. Confirmed with bedside nurse the patient's most recent platelet count. Confirmed with patient that they are not currently taking any anticoagulation, have any bleeding history or any family history of bleeding disorders. Patient expressed understanding and wished to proceed. All questions were answered. Sterile technique was used throughout the entire procedure. Please see nursing notes for vital  signs. Test dose was given through epidural catheter and negative prior to continuing to dose epidural or start infusion. Warning signs of high block given to the patient including shortness of breath, tingling/numbness in hands, complete motor block, or any concerning symptoms with instructions to call for help. Patient was given instructions on fall risk and not to get out of bed. All questions and concerns addressed with instructions to call with any issues or inadequate analgesia.    Patient tolerated the insertion well without immediate complications. Reason for block:procedure for pain

## 2024-01-05 ENCOUNTER — Other Ambulatory Visit: Payer: Managed Care, Other (non HMO)

## 2024-01-05 ENCOUNTER — Encounter: Payer: Self-pay | Admitting: Obstetrics and Gynecology

## 2024-01-05 ENCOUNTER — Encounter: Payer: Managed Care, Other (non HMO) | Admitting: Certified Nurse Midwife

## 2024-01-05 ENCOUNTER — Encounter
Admission: EM | Disposition: A | Discharge status: Home / Self Care | Payer: Self-pay | Source: Home / Self Care | Attending: Licensed Practical Nurse

## 2024-01-05 DIAGNOSIS — O34211 Maternal care for low transverse scar from previous cesarean delivery: Secondary | ICD-10-CM

## 2024-01-05 DIAGNOSIS — Z3A41 41 weeks gestation of pregnancy: Secondary | ICD-10-CM

## 2024-01-05 DIAGNOSIS — Z3202 Encounter for pregnancy test, result negative: Secondary | ICD-10-CM

## 2024-01-05 DIAGNOSIS — O480 Post-term pregnancy: Secondary | ICD-10-CM | POA: Diagnosis not present

## 2024-01-05 DIAGNOSIS — O99820 Streptococcus B carrier state complicating pregnancy: Secondary | ICD-10-CM | POA: Diagnosis not present

## 2024-01-05 DIAGNOSIS — Z302 Encounter for sterilization: Secondary | ICD-10-CM

## 2024-01-05 SURGERY — Surgical Case
Anesthesia: Epidural | Laterality: Bilateral

## 2024-01-05 MED ORDER — LIDOCAINE-EPINEPHRINE (PF) 2 %-1:200000 IJ SOLN
INTRAMUSCULAR | Status: DC | PRN
  Administered 2024-01-05: 5 mL via EPIDURAL
  Administered 2024-01-05: 10 mL via EPIDURAL
  Administered 2024-01-05: 5 mL via EPIDURAL

## 2024-01-05 MED ORDER — NALOXONE HCL 0.4 MG/ML IJ SOLN: 0.4000 mg | INTRAMUSCULAR | Status: DC | PRN

## 2024-01-05 MED ORDER — PRENATAL MULTIVITAMIN CH
1.0000 | ORAL_TABLET | Freq: Every day | ORAL | Status: DC
  Administered 2024-01-06 – 2024-01-07 (×2): 1 via ORAL
  Filled 2024-01-05 (×2): qty 1

## 2024-01-05 MED ORDER — OXYTOCIN-SODIUM CHLORIDE 30-0.9 UT/500ML-% IV SOLN
2.5000 [IU]/h | INTRAVENOUS | Status: AC
  Administered 2024-01-05 – 2024-01-06 (×2): 2.5 [IU]/h via INTRAVENOUS
  Filled 2024-01-05 (×2): qty 500

## 2024-01-05 MED ORDER — MORPHINE SULFATE (PF) 0.5 MG/ML IJ SOLN
INTRAMUSCULAR | Status: DC | PRN
  Administered 2024-01-05: 1 mg via INTRAVENOUS
  Administered 2024-01-05: 3 mg via INTRAVENOUS
  Administered 2024-01-05: 1 mg via INTRAVENOUS

## 2024-01-05 MED ORDER — ZOLPIDEM TARTRATE 5 MG PO TABS: 5.0000 mg | ORAL_TABLET | Freq: Every evening | ORAL | Status: DC | PRN

## 2024-01-05 MED ORDER — SOD CITRATE-CITRIC ACID 500-334 MG/5ML PO SOLN: 30.0000 mL | ORAL | Status: DC

## 2024-01-05 MED ORDER — SENNOSIDES-DOCUSATE SODIUM 8.6-50 MG PO TABS
2.0000 | ORAL_TABLET | Freq: Every day | ORAL | Status: DC
  Administered 2024-01-06 – 2024-01-07 (×2): 2 via ORAL
  Filled 2024-01-05 (×2): qty 2

## 2024-01-05 MED ORDER — ACETAMINOPHEN 500 MG PO TABS
1000.0000 mg | ORAL_TABLET | Freq: Four times a day (QID) | ORAL | Status: DC
Start: 2024-01-05 — End: 2024-01-08
  Administered 2024-01-05 – 2024-01-07 (×9): 1000 mg via ORAL
  Filled 2024-01-05 (×9): qty 2

## 2024-01-05 MED ORDER — METHYLERGONOVINE MALEATE 0.2 MG PO TABS
0.2000 mg | ORAL_TABLET | ORAL | Status: DC | PRN
Start: 2024-01-05 — End: 2024-01-08
  Administered 2024-01-06: 0.2 mg via ORAL
  Filled 2024-01-05: qty 1

## 2024-01-05 MED ORDER — KETOROLAC TROMETHAMINE 30 MG/ML IJ SOLN
INTRAMUSCULAR | Status: AC
  Filled 2024-01-05: qty 1

## 2024-01-05 MED ORDER — MORPHINE SULFATE (PF) 0.5 MG/ML IJ SOLN
INTRAMUSCULAR | Status: AC
  Filled 2024-01-05: qty 10

## 2024-01-05 MED ORDER — PHENYLEPHRINE HCL-NACL 20-0.9 MG/250ML-% IV SOLN
INTRAVENOUS | Status: DC | PRN
  Administered 2024-01-05: 50 ug/min via INTRAVENOUS

## 2024-01-05 MED ORDER — BUPIVACAINE HCL (PF) 0.5 % IJ SOLN
INTRAMUSCULAR | Status: AC
  Filled 2024-01-05: qty 30

## 2024-01-05 MED ORDER — SODIUM CHLORIDE 0.9 % IV SOLN
500.0000 mg | INTRAVENOUS | Status: AC
  Administered 2024-01-05: 500 mg via INTRAVENOUS
  Filled 2024-01-05: qty 5

## 2024-01-05 MED ORDER — LACTATED RINGERS IV SOLN: INTRAVENOUS | Status: DC | PRN

## 2024-01-05 MED ORDER — SIMETHICONE 80 MG PO CHEW
80.0000 mg | CHEWABLE_TABLET | Freq: Three times a day (TID) | ORAL | Status: DC
  Administered 2024-01-05 – 2024-01-07 (×8): 80 mg via ORAL
  Filled 2024-01-05 (×7): qty 1

## 2024-01-05 MED ORDER — LIDOCAINE 5 % EX PTCH
1.0000 | MEDICATED_PATCH | CUTANEOUS | Status: DC
  Administered 2024-01-06: 1 via TRANSDERMAL
  Filled 2024-01-05: qty 1

## 2024-01-05 MED ORDER — DIPHENHYDRAMINE HCL 25 MG PO CAPS: 25.0000 mg | ORAL_CAPSULE | Freq: Four times a day (QID) | ORAL | Status: DC | PRN

## 2024-01-05 MED ORDER — KETOROLAC TROMETHAMINE 30 MG/ML IJ SOLN: 30.0000 mg | Freq: Four times a day (QID) | INTRAMUSCULAR | Status: AC | PRN

## 2024-01-05 MED ORDER — WITCH HAZEL-GLYCERIN EX PADS: 1.0000 | MEDICATED_PAD | CUTANEOUS | Status: DC | PRN

## 2024-01-05 MED ORDER — GABAPENTIN 100 MG PO CAPS
100.0000 mg | ORAL_CAPSULE | Freq: Three times a day (TID) | ORAL | Status: DC
  Administered 2024-01-05 – 2024-01-07 (×6): 100 mg via ORAL
  Filled 2024-01-05 (×7): qty 1

## 2024-01-05 MED ORDER — SIMETHICONE 80 MG PO CHEW: 80.0000 mg | CHEWABLE_TABLET | ORAL | Status: DC | PRN

## 2024-01-05 MED ORDER — KETOROLAC TROMETHAMINE 30 MG/ML IJ SOLN
INTRAMUSCULAR | Status: DC | PRN
  Administered 2024-01-05: 30 mg via INTRAVENOUS

## 2024-01-05 MED ORDER — MENTHOL 3 MG MT LOZG: 1.0000 | LOZENGE | OROMUCOSAL | Status: DC | PRN

## 2024-01-05 MED ORDER — DEXMEDETOMIDINE HCL IN NACL 80 MCG/20ML IV SOLN
INTRAVENOUS | Status: DC | PRN
  Administered 2024-01-05: 8 ug via INTRAVENOUS

## 2024-01-05 MED ORDER — SCOPOLAMINE 1 MG/3DAYS TD PT72: 1.0000 | MEDICATED_PATCH | Freq: Once | TRANSDERMAL | Status: DC

## 2024-01-05 MED ORDER — PHENYLEPHRINE 80 MCG/ML (10ML) SYRINGE FOR IV PUSH (FOR BLOOD PRESSURE SUPPORT)
PREFILLED_SYRINGE | INTRAVENOUS | Status: DC | PRN
  Administered 2024-01-05 (×4): 80 ug via INTRAVENOUS

## 2024-01-05 MED ORDER — DIPHENHYDRAMINE HCL 25 MG PO CAPS: 25.0000 mg | ORAL_CAPSULE | ORAL | Status: DC | PRN

## 2024-01-05 MED ORDER — ACETAMINOPHEN 500 MG PO TABS
1000.0000 mg | ORAL_TABLET | Freq: Four times a day (QID) | ORAL | Status: DC
  Filled 2024-01-05: qty 2

## 2024-01-05 MED ORDER — ONDANSETRON HCL 4 MG/2ML IJ SOLN: 4.0000 mg | Freq: Three times a day (TID) | INTRAMUSCULAR | Status: DC | PRN

## 2024-01-05 MED ORDER — COCONUT OIL OIL
1.0000 | TOPICAL_OIL | Status: DC | PRN
  Filled 2024-01-05: qty 7.5

## 2024-01-05 MED ORDER — PHENYLEPHRINE 80 MCG/ML (10ML) SYRINGE FOR IV PUSH (FOR BLOOD PRESSURE SUPPORT)
PREFILLED_SYRINGE | INTRAVENOUS | Status: AC
  Administered 2024-01-05: 80 ug via INTRAVENOUS
  Filled 2024-01-05: qty 10

## 2024-01-05 MED ORDER — DIBUCAINE (PERIANAL) 1 % EX OINT: 1.0000 | TOPICAL_OINTMENT | CUTANEOUS | Status: DC | PRN

## 2024-01-05 MED ORDER — PHENYLEPHRINE 80 MCG/ML (10ML) SYRINGE FOR IV PUSH (FOR BLOOD PRESSURE SUPPORT)
PREFILLED_SYRINGE | INTRAVENOUS | Status: AC
  Filled 2024-01-05: qty 10

## 2024-01-05 MED ORDER — ONDANSETRON HCL 4 MG/2ML IJ SOLN
INTRAMUSCULAR | Status: AC
  Filled 2024-01-05: qty 2

## 2024-01-05 MED ORDER — IBUPROFEN 600 MG PO TABS: 600.0000 mg | ORAL_TABLET | Freq: Four times a day (QID) | ORAL | Status: DC

## 2024-01-05 MED ORDER — OXYCODONE HCL 5 MG PO TABS
5.0000 mg | ORAL_TABLET | Freq: Four times a day (QID) | ORAL | Status: DC | PRN
  Administered 2024-01-05 – 2024-01-06 (×3): 5 mg via ORAL
  Filled 2024-01-05 (×3): qty 1

## 2024-01-05 MED ORDER — DIPHENHYDRAMINE HCL 50 MG/ML IJ SOLN: 12.5000 mg | INTRAMUSCULAR | Status: DC | PRN

## 2024-01-05 MED ORDER — SOD CITRATE-CITRIC ACID 500-334 MG/5ML PO SOLN
ORAL | Status: AC
  Filled 2024-01-05: qty 15

## 2024-01-05 MED ORDER — KETOROLAC TROMETHAMINE 30 MG/ML IJ SOLN
30.0000 mg | Freq: Four times a day (QID) | INTRAMUSCULAR | Status: AC
  Administered 2024-01-05 – 2024-01-06 (×3): 30 mg via INTRAVENOUS
  Filled 2024-01-05 (×3): qty 1

## 2024-01-05 MED ORDER — METHYLERGONOVINE MALEATE 0.2 MG/ML IJ SOLN
0.2000 mg | INTRAMUSCULAR | Status: DC | PRN
  Administered 2024-01-05: 0.2 mg via INTRAMUSCULAR
  Filled 2024-01-05: qty 1

## 2024-01-05 MED ORDER — ONDANSETRON HCL 4 MG/2ML IJ SOLN
INTRAMUSCULAR | Status: DC | PRN
  Administered 2024-01-05: 4 mg via INTRAVENOUS

## 2024-01-05 MED ORDER — MEPERIDINE HCL 25 MG/ML IJ SOLN: 6.2500 mg | INTRAMUSCULAR | Status: DC | PRN

## 2024-01-05 MED ORDER — SODIUM CHLORIDE 0.9% FLUSH: 3.0000 mL | INTRAVENOUS | Status: DC | PRN

## 2024-01-05 MED ORDER — EPHEDRINE 5 MG/ML INJ
INTRAVENOUS | Status: AC
  Filled 2024-01-05: qty 5

## 2024-01-05 MED ORDER — CEFAZOLIN SODIUM-DEXTROSE 2-4 GM/100ML-% IV SOLN
2.0000 g | INTRAVENOUS | Status: AC
  Administered 2024-01-05: 2 g via INTRAVENOUS
  Filled 2024-01-05: qty 100

## 2024-01-05 MED ORDER — PHENYLEPHRINE HCL-NACL 20-0.9 MG/250ML-% IV SOLN
INTRAVENOUS | Status: AC
Start: 2024-01-05 — End: ?
  Filled 2024-01-05: qty 250

## 2024-01-05 MED ORDER — NALOXONE HCL 4 MG/10ML IJ SOLN: 1.0000 ug/kg/h | INTRAVENOUS | Status: DC | PRN

## 2024-01-05 SURGICAL SUPPLY — 28 items
BAG COUNTER SPONGE SURGICOUNT (BAG) ×2 IMPLANT
BENZOIN TINCTURE PRP APPL 2/3 (GAUZE/BANDAGES/DRESSINGS) IMPLANT
CHLORAPREP W/TINT 26 (MISCELLANEOUS) ×4 IMPLANT
DERMABOND ADVANCED .7 DNX12 (GAUZE/BANDAGES/DRESSINGS) IMPLANT
DRESSING TELFA 8X10 (GAUZE/BANDAGES/DRESSINGS) ×1 IMPLANT
DRSG TELFA 3X8 NADH STRL (GAUZE/BANDAGES/DRESSINGS) ×2 IMPLANT
ELECT REM PT RETURN 9FT ADLT (ELECTROSURGICAL) ×2
ELECTRODE REM PT RTRN 9FT ADLT (ELECTROSURGICAL) ×2 IMPLANT
GAUZE SPONGE 4X4 12PLY STRL (GAUZE/BANDAGES/DRESSINGS) ×2 IMPLANT
GAUZE SPONGE 4X4 12PLY STRL LF (GAUZE/BANDAGES/DRESSINGS) ×1 IMPLANT
GLOVE BIO SURGEON STRL SZ 6.5 (GLOVE) ×2 IMPLANT
GLOVE INDICATOR 7.0 STRL GRN (GLOVE) ×2 IMPLANT
GOWN STRL REUS W/ TWL LRG LVL3 (GOWN DISPOSABLE) ×4 IMPLANT
KIT TURNOVER KIT A (KITS) ×2 IMPLANT
MANIFOLD NEPTUNE II (INSTRUMENTS) ×2 IMPLANT
MAT PREVALON FULL STRYKER (MISCELLANEOUS) ×2 IMPLANT
NS IRRIG 1000ML POUR BTL (IV SOLUTION) ×2 IMPLANT
PACK C SECTION AR (MISCELLANEOUS) ×2 IMPLANT
PAD OB MATERNITY 11 LF (PERSONAL CARE ITEMS) ×2 IMPLANT
PAD PREP OB/GYN DISP 24X41 (PERSONAL CARE ITEMS) ×2 IMPLANT
RETRACTOR TRAXI PANNICULUS (MISCELLANEOUS) IMPLANT
SCRUB CHG 4% DYNA-HEX 4OZ (MISCELLANEOUS) ×2 IMPLANT
STRIP CLOSURE SKIN 1/2X4 (GAUZE/BANDAGES/DRESSINGS) IMPLANT
SUT MNCRL AB 4-0 PS2 18 (SUTURE) ×2 IMPLANT
SUT VIC AB 0 CT1 36 (SUTURE) ×8 IMPLANT
SUT VIC AB 3-0 SH 27X BRD (SUTURE) ×2 IMPLANT
TRAP FLUID SMOKE EVACUATOR (MISCELLANEOUS) ×2 IMPLANT
WATER STERILE IRR 500ML POUR (IV SOLUTION) ×2 IMPLANT

## 2024-01-05 NOTE — Progress Notes (Signed)
    OB/GYN Attending Progress Note    I was called in to assess patient for evaluation for C-section by Guadlupe Spanish, CNM.  Patient is a 28 y.o. [id number] at [redacted]w[redacted]d who presented for IOL for postdates, history of previous C-section x 2 desiring TOLAC.  Patient currently with minimal change over the past 12 hours with use of Pitocin. Contractions noted to be regularly occurring (q 2-3 min) however not adequate. Had Pitocin break overnight for ~ 2 hours due to Category II tracing. Patient at this time desires to discontinue trial of labor and proceed with C-section. Cervical exam currently 6.5/70/0 to +1. Currently now Category I tracing.   The risks of surgery were discussed with the patient including but were not limited to: bleeding which may require transfusion or reoperation; infection which may require antibiotics; injury to bowel, bladder, ureters or other surrounding organs; injury to the fetus; need for additional procedures including hysterectomy in the event of a life-threatening hemorrhage; formation of adhesions; placental abnormalities with subsequent pregnancies; incisional problems; thromboembolic phenomenon and other postoperative/anesthesia complications. She also desires tubal ligation with procedure. Risks and benefits of procedure have been explained, along with all other alternatives. The patient concurred with the proposed plan, giving informed written consent for the procedure.  Anesthesia and OR aware. Preoperative prophylactic antibiotics and SCDs ordered on call to the OR.  To OR when ready.       Hildred Laser, MD [facility]

## 2024-01-05 NOTE — Progress Notes (Signed)
Labor Progress Note   ASSESSMENT/PLAN   Cindy Aguilar 28 y.o.   [id number]  at [redacted]w[redacted]d here for IOL/TOLAC.  FWB:  - Fetal well being assessed: Category I/II        GBS: - GBS positive, adequately treated  LABOR: -  Arrest of dilation, first stage of labor. - Discussed minimal change and inadequate contractions. Reviewed options with Aleli and family of continuing IOL or proceeding with cesarean birth at this time. Cindy Aguilar prefers to proceed with CS. She desires a BTL with her CS. - Pain Management: Epidural - Pitocin discontinued. - Dr. Valentino Saxon notified  Labor Progress 01/04/24 4540: 1/50/-2. Cook placed. 1220: 3/50, Cook expelled 1636: 5/50/-3 2100: 5.5/50/-2 01/05/24 0100: 6.5/70/-1, 0 0422: 6/60/--1 9811: 6.5/70/0,-1  Principal Problem:   Labor and delivery, indication for care   SUBJECTIVE/OBJECTIVE   SUBJECTIVE:  Cindy Aguilar is feeling some pressure with contractions.   OBJECTIVE: Vital Signs: Patient Vitals for the past 12 hrs:  BP Temp Temp src Pulse Resp SpO2  01/05/24 0832 -- -- -- -- -- 99 %  01/05/24 0829 -- -- -- -- -- 99 %  01/05/24 0824 -- -- -- -- -- 99 %  01/05/24 0819 -- -- -- -- -- 98 %  01/05/24 0814 -- -- -- -- -- 98 %  01/05/24 0813 (!) 119/53 -- -- 94 -- --  01/05/24 0809 -- -- -- -- -- 98 %  01/05/24 0804 -- -- -- -- -- 98 %  01/05/24 0759 -- -- -- -- -- 99 %  01/05/24 0754 -- -- -- -- -- 98 %  01/05/24 0749 -- -- -- -- -- 98 %  01/05/24 0744 -- -- -- -- -- 98 %  01/05/24 0739 -- -- -- -- -- 100 %  01/05/24 0734 -- -- -- -- -- 100 %  01/05/24 0714 (!) 116/92 99.3 F (37.4 C) Oral -- 18 100 %  01/05/24 0415 (!) 100/55 99 F (37.2 C) Axillary 96 18 100 %  01/05/24 0215 120/86 -- -- 100 -- 100 %  01/05/24 0115 127/77 -- -- (!) 109 -- 100 %  01/05/24 0110 -- 98.5 F (36.9 C) Oral -- -- 100 %  01/04/24 2340 116/71 -- -- 84 -- 99 %  01/04/24 2240 110/63 -- -- 78 -- 99 %  01/04/24 2140 (!) 98/55 99 F (37.2 C) Oral 76 18 --  01/04/24 2125 105/60 --  -- 77 -- 100 %  01/04/24 2055 119/72 -- -- 85 -- 100 %    Last SVE:  Dilation: 6.5 Effacement (%): 70 Cervical Position: Middle Station: 0 Presentation: Vertex Exam by:: Kaitlen Redford, CNM -  , Rupture Date: 01/04/24, Rupture Time: 2102,    FHR:   - Baseline: 145 - Variability: moderate, periods of minimal - Accels: present - Decels:early Category I/II  UTERINE ACTIVITY:  Contractions:q 3-4 minutes  Glenetta Borg, CNM

## 2024-01-05 NOTE — Lactation Note (Signed)
This note was copied from a baby's chart. Lactation Consultation Note  Patient Name: Cindy Aguilar [id number] Date: 01/05/2024 Age:30 hours Reason for consult: L&D Initial assessment;1st time breastfeeding;Term;Breastfeeding assistance;RN request   Maternal Data Does the patient have breastfeeding experience prior to this delivery?: Yes How long did the patient breastfeed?: 2nd - 34yrs  Initial assessment w/ a P3 patient and 1hr old baby Cindy "Cindy Aguilar" in L&D.  This was a c-section delivery.  Patient w/ a hx of PCOS and depression. Feeding goal is breastfeeding.  Patient verbalized that she breastfed her 2nd kid for 68yrs w/ no issues.  Patient has a Spectra pump,hand pump and a Haakaa.   Feeding Mother's Current Feeding Choice: Breast Milk  Infant was placed to the left breast in football hold.  Patient has good positioning and handles infant well at the breast.  Patient latched infant w/ minimal assistance from [facility].  Infant had wide open gape w/ audible swallows heard.   LATCH Score Latch: Grasps breast easily, tongue down, lips flanged, rhythmical sucking.  Audible Swallowing: Spontaneous and intermittent  Type of Nipple: Everted at rest and after stimulation  Comfort (Breast/Nipple): Soft / non-tender  Hold (Positioning): No assistance needed to correctly position infant at breast.  LATCH Score: 10  Interventions Interventions: Breast feeding basics reviewed;Assisted with latch;Support pillows;Education  LC provided education on the following;  milk production expectations, hunger cues,  hand expression, benefits of STS and arousing infant for a feeding.  Lactation informed patient of feeding infant at least 8 or more times w/in a 24hr period but not exceeding 3hrs. Patient verbalized understanding.   Consult Status Consult Status: Follow-up Follow-up type: In-patient    Yvette Rack Cole Klugh 01/05/2024, 12:13 PM

## 2024-01-05 NOTE — Transfer of Care (Signed)
Immediate Anesthesia Transfer of Care Note  Patient: Cindy Aguilar  Procedure(s) Performed: CESAREAN SECTION WITH BILATERAL TUBAL LIGATION (Bilateral)  Patient Location: PACU and Mother/Baby  Anesthesia Type:Epidural  Level of Consciousness: awake, alert , and oriented  Airway & Oxygen Therapy: Patient Spontanous Breathing  Post-op Assessment: Report given to RN and Post -op Vital signs reviewed and stable  Post vital signs: Reviewed and stable  Last Vitals:  Vitals Value Taken Time  BP 104/69 01/05/24 1119  Temp    Pulse 88 01/05/24 1118  Resp 10 01/05/24 1119  SpO2 97 % 01/05/24 1119  Vitals shown include unfiled device data.  Last Pain:  Vitals:   01/05/24 0930  TempSrc: Oral  PainSc:       Patients Stated Pain Goal: 0 (01/04/24 0836)  Complications: No notable events documented.

## 2024-01-05 NOTE — Progress Notes (Signed)
Vitals and bleeding have normalized.  Pt feeling better, just sore.  Tolerating regular diet and drinking fluids.

## 2024-01-05 NOTE — Progress Notes (Signed)
Cindy Aguilar is a 28 y.o. G3P2002 at [redacted]w[redacted]d by LMP admitted for induction of labor due to postdates, TOLAC.  Subjective: Resting with epidural. Cindy Aguilar has been able to get some sleep.   Objective: BP 120/86   Pulse 100   Temp 98.5 F (36.9 C) (Oral)   Resp 18   Ht 5\' 3"  (1.6 m)   Wt 83 kg   LMP 03/23/2023   SpO2 100%   BMI 32.41 kg/m  I/O last 3 completed shifts: In: 240 [P.O.:240] Out: -  No intake/output data recorded.  FHT:  FHR 135, moderate variability with periods of minimal, accels present, late x1.  UC:   regular, every 6-7 minutes SVE:   Dilation: 6 Effacement (%): 60 Station: -1 Exam by:: Dominic, CNM  Labs: Lab Results  Component Value Date   WBC 10.7 (H) 01/04/2024   HGB 10.6 (L) 01/04/2024   HCT 33.7 (L) 01/04/2024   MCV 73.1 (L) 01/04/2024   PLT 335 01/04/2024    Assessment / Plan: Induction of labor at 41 weeks for postdates and TOLAC.  Pitocin paused around 0230 due to period of minimal variability. Restarted around 0420.  IUPC placed.  MD Dalbert Garnet updated via telephone.  Discussed labor progress with patient including potential need for c/s.  Cervical check in 4 hours.  Labor:  pitocin restarted, slow change.  Preeclampsia:   n/a Fetal Wellbeing:  Category II Pain Control:  Epidural I/D:  n/a Anticipated MOD:   VBAC  Eloise Levels, Student-MidWife 01/05/2024, 4:28 AM Carie Caddy, CNM present for all portions of care.

## 2024-01-05 NOTE — Op Note (Signed)
Cesarean Section Procedure Note  Indications: failed induction of labor  Pre-operative Diagnosis: 41 week 1 day pregnancy, post-dates. History of prior C-section x 2 desiring TOLAC. 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 Undesired fertility.   Post-operative Diagnosis: Same  Surgeon: Hildred Laser, MD  Assistants:  Guadlupe Spanish, CNM  Procedure: Primary low transverse Cesarean Section  Anesthesia: Epidural anesthesia  Findings: Female infant, cephalic presentation, 4450 grams, with Apgar scores of 8 at one minute and 9 at five minutes. Intact placenta with 3 vessel cord.  Clear amniotic fluid at amniotomy The uterine outline, tubes and ovaries appeared normal.   Procedure Details: The patient was seen in the Holding Room. The risks, benefits, complications, treatment options, and expected outcomes were discussed with the patient.  The patient concurred with the proposed plan, giving informed consent.  The site of surgery properly noted/marked. The patient was taken to the Operating Room, identified as Cindy Aguilar and the procedure verified as C-Section Delivery. A Time Out was held and the above information confirmed.  After induction of anesthesia, the patient was draped and prepped in the usual sterile manner. Anesthesia was tested and noted to be adequate. A Pfannenstiel incision was made and carried down through the subcutaneous tissue to the fascia. Fascial incision was made and extended transversely. The fascia was separated from the underlying rectus tissue superiorly and inferiorly. The peritoneum was identified and entered. Peritoneal incision was extended longitudinally. The surgical assist was able to provide retraction to allow for clear visualization of surgical site. An Alexis retractor was placed in the abdomen for additional retraction. The utero-vesical peritoneal reflection was incised transversely and the bladder flap was  bluntly freed from the lower uterine segment. A low transverse uterine incision was made. Delivered from cephalic presentation was a 4450 gram Female with Apgar scores of 8 at one minute and 9 at five minutes.  The assistant was able to apply adequate fundal pressure to allow for successful delivery of the fetus. After the umbilical cord was clamped and cut, no cord blood was obtained for evaluation (not indicated). Delayed cord clamping was observed. The placenta was removed intact and appeared normal. The uterus was exteriorized and cleared of all clots and debris. The uterine outline, tubes and ovaries appeared normal.  The uterine incision was closed with running locked sutures of 0-Vicryl.  A second suture of 0-Vicryl was used in an imbricating layer.  Hemostasis was observed. The bladder flap was repaired using a 3-0 Vicryl in a running fashion.   Next, attention was then turned to the fallopian tubes, and where the patient's right fallopian tube was identified and grasped with a Babcock clamp.  The tube was then followed out to the fimbria.  The Babcock clamp was then used to grasp the tube approximately 4 cm from the cornual region.  A 3 cm segment of tube was then ligated with a free tie of 0-Chromic using the [place] method and excised.  The left fallopian tube was then ligated in a similar fashion and excised. The tubal lumens were cauterized bilaterally.  Good hemostasis was noted with bilateral fallopian tubes. The uterus was then returned to the abdomen.   The pericolic gutters were cleared of all clots and debris. The fascia was then reapproximated with a running suture of 0-Vicryl. The subcutaneous fat layer was reapproximated with 2-0 Vicryl. The skin was reapproximated with 4-0 Monocryl. The incision was covered with steri-strips and a pressure dressing. A lidoderm patch was placed  over the incision.   Instrument, sponge, and needle counts were correct prior the abdominal closure and at the  conclusion of the case.    An experienced assistant was required given the standard of surgical care given the complexity of the case.  This assistant was needed for exposure, dissection, suctioning, retraction, instrument exchange, and for overall help during the procedure.  Estimated Blood Loss:  550 ml      Drains: foley catheter to gravity drainage, 150 ml of clear urine at end of the procedure         Total IV Fluids:   750 ml  Specimens: Segments of bilateral fallopian tubes.          Implants: None         Complications:  None; patient tolerated the procedure well.         Disposition: PACU - hemodynamically stable.         Condition: stable   Hildred Laser, MD [facility]

## 2024-01-05 NOTE — Significant Event (Signed)
Called to bedside to assess heavy vaginal bleeding. Chux pad under Cindy Aguilar saturated.  [name] appears pale and reports feeling lightheaded. BP 80/50. Fundal massage, methergine IM, and IVF bolus initiated with improvement in bleeding and symptoms. QBL 360. Will continue IV fluids and monitor bleeding closely. Dr. Valentino Saxon notified.  Glenetta Borg, CNM

## 2024-01-06 ENCOUNTER — Encounter: Payer: Self-pay | Admitting: Obstetrics and Gynecology

## 2024-01-06 LAB — CBC
HCT: 17.2 % — ABNORMAL LOW (ref 36.0–46.0)
Hemoglobin: 5.4 g/dL — ABNORMAL LOW (ref 12.0–15.0)
MCH: 23.1 pg — ABNORMAL LOW (ref 26.0–34.0)
MCHC: 31.4 g/dL (ref 30.0–36.0)
MCV: 73.5 fL — ABNORMAL LOW (ref 80.0–100.0)
Platelets: 222 10*3/uL (ref 150–400)
RBC: 2.34 MIL/uL — ABNORMAL LOW (ref 3.87–5.11)
RDW: 18.3 % — ABNORMAL HIGH (ref 11.5–15.5)
WBC: 16.9 10*3/uL — ABNORMAL HIGH (ref 4.0–10.5)
nRBC: 0.2 % (ref 0.0–0.2)

## 2024-01-06 LAB — SURGICAL PATHOLOGY

## 2024-01-06 LAB — HEMOGLOBIN AND HEMATOCRIT, BLOOD
HCT: 24 % — ABNORMAL LOW (ref 36.0–46.0)
Hemoglobin: 7.8 g/dL — ABNORMAL LOW (ref 12.0–15.0)

## 2024-01-06 LAB — PREPARE RBC (CROSSMATCH)

## 2024-01-06 MED ORDER — ACETAMINOPHEN 325 MG PO TABS: 650.0000 mg | ORAL_TABLET | Freq: Once | ORAL | Status: AC

## 2024-01-06 MED ORDER — MISOPROSTOL 200 MCG PO TABS
ORAL_TABLET | ORAL | Status: AC
  Filled 2024-01-06: qty 4

## 2024-01-06 MED ORDER — METHYLERGONOVINE MALEATE 0.2 MG PO TABS
0.2000 mg | ORAL_TABLET | ORAL | Status: AC
Start: 2024-01-06 — End: 2024-01-06
  Administered 2024-01-06 (×5): 0.2 mg via ORAL
  Filled 2024-01-06 (×5): qty 1

## 2024-01-06 MED ORDER — CALCIUM CARBONATE ANTACID 500 MG PO CHEW
1.0000 | CHEWABLE_TABLET | ORAL | Status: DC | PRN
  Administered 2024-01-06: 200 mg via ORAL
  Filled 2024-01-06: qty 2

## 2024-01-06 MED ORDER — DIPHENHYDRAMINE HCL 25 MG PO CAPS
25.0000 mg | ORAL_CAPSULE | Freq: Once | ORAL | Status: AC
  Administered 2024-01-06: 25 mg via ORAL
  Filled 2024-01-06: qty 1

## 2024-01-06 MED ORDER — METHYLERGONOVINE MALEATE 0.2 MG/ML IJ SOLN
0.2000 mg | INTRAMUSCULAR | Status: AC
Start: 2024-01-06 — End: 2024-01-06
  Filled 2024-01-06 (×6): qty 1

## 2024-01-06 MED ORDER — SODIUM CHLORIDE 0.9% IV SOLUTION: Freq: Once | INTRAVENOUS | Status: AC

## 2024-01-06 MED ORDER — OXYCODONE HCL 5 MG PO TABS
5.0000 mg | ORAL_TABLET | ORAL | Status: DC | PRN
Start: 2024-01-06 — End: 2024-01-08
  Administered 2024-01-06 – 2024-01-07 (×4): 5 mg via ORAL
  Filled 2024-01-06: qty 2
  Filled 2024-01-06 (×3): qty 1

## 2024-01-06 MED ORDER — MISOPROSTOL 200 MCG PO TABS
400.0000 ug | ORAL_TABLET | Freq: Once | ORAL | Status: AC
  Administered 2024-01-06: 400 ug via BUCCAL
  Filled 2024-01-06: qty 2

## 2024-01-06 NOTE — Progress Notes (Addendum)
Pt called out to go to restroom at 0240. Nurse tech went to assist pt.   Nurse tech called nurse in to assess pt because she had passed a softball-sized clot in the hat. While nurse was in the bathroom, pt passed at least 2 more softball-sized clots in the hat. Nurse and nurse tech got pt back to bed. Vitals taken, they are were all stable.   Pt still trickling in bed. Nurse delegated to [facility] to call [place] [facility] provider on call.   Nurse gave 0.2mg  methergine tablet at 0247.   [facility] came back with order for cytotec buccal to be given. Given to pt at 0258.   CNM Missy Swanson at pt bedside to assess around 34.   Will get up to use restroom again in about 45 minutes and assess how pt is feeling. Vital signs remained stable throughout this entire event.   Total EBL for this event was .

## 2024-01-06 NOTE — Progress Notes (Signed)
Postpartum Day # 1: Cesarean Delivery (repeat) with BTL  Subjective: Patient reports tolerating PO and no problems voiding.  Has noted occasional episodes of feeling dizzy. Had 2 episodes of large gushes of blood since her delivery yesterday. Breastfeeding is going well. Is ambulating.   Objective: Vital signs in last 24 hours: Temp:  [98.1 F (36.7 C)-99.5 F (37.5 C)] 99.5 F (37.5 C) (01/24 0749) Pulse Rate:  [70-136] 98 (01/24 0749) Resp:  [10-24] 18 (01/24 0749) BP: (62-132)/(34-88) 125/84 (01/24 0749) SpO2:  [95 %-100 %] 96 % (01/24 0749)  Physical Exam:  General: alert and no distress Lungs: clear to auscultation bilaterally Breasts: normal appearance, no masses or tenderness Heart: regular rate and rhythm, S1, S2 normal, no murmur, click, rub or gallop Abdomen: soft, non-tender; bowel sounds normal; no masses,  no organomegaly Pelvis: Lochia appropriate, Uterine Fundus firm, Incision: healing well, no significant drainage, no dehiscence, no significant erythema Extremities: DVT Evaluation: No evidence of DVT seen on physical exam. Negative Homan's sign. No cords or calf tenderness.   Recent Labs    01/04/24 0556 01/06/24 0500  HGB 10.6* 5.4*  HCT 33.7* 17.2*    Assessment/Plan: Status post Cesarean section. Postoperative course complicated by postpartum hemorrhage with symptomatic anemia.   Breastfeeding  Contraception: postpartum tubal ligation Regular diet as tolerated Continue PO pain management Recommend blood transfusion for symptomatic anemia. Will transfuse 2 units PRBCs.    Hildred Laser, MD [facility] at [facility]

## 2024-01-06 NOTE — Discharge Instructions (Signed)
Discharge Instructions:   If there are any new medications, they have been ordered and will be available for pickup at the listed pharmacy on your way home from the hospital.   Call office if you have any of the following: headache, visual changes, fever >101.0 F, chills, shortness of breath, breast concerns, excessive vaginal bleeding, incision drainage or problems, leg pain or redness, depression or any other concerns. If you have vaginal discharge with an odor, let your doctor know.   It is normal to bleed for up to 6 weeks. You should not soak through more than 1 pad in 1 hour. If you have a blood clot larger than your fist with continued bleeding, call your doctor.   After a c-section, you should expect a small amount of blood or clear fluid coming from the incision and abdominal cramping/soreness. Inspect your incision site daily. Stand in front of a mirror to look for any redness, incision opening, or discolored/odorness drainage. Take a shower daily and continue good hygiene. Use own towel and washcloth (do not share). Make sure your sheets on your bed are clean. No pets sleeping around your incision site. Dressing will be removed at your postpartum visit. If the dressing does become wet or soiled underneath, it is okay to remove it.   Activity: Do not lift > 10 lbs for 6 weeks (do not lift anything heavier than your baby). No intercourse, tampons, swimming pools, hot tubs, baths (only showers) for 6 weeks.  No driving for 1-2 weeks. Continue prenatal vitamin, especially if breastfeeding. Increase calories and fluids (water) while breastfeeding.   Your milk will come in, in the next couple of days (right now it is colostrum). You may have a slight fever when your milk comes in, but it should go away on its own.  If it does not, and rises above 101 F please call the doctor. You will also feel achy and your breasts will be firm. They will also start to leak. If you are breastfeeding, continue  as you have been and you can pump/express milk for comfort.   If you have too much milk, your breasts can become engorged, which could lead to mastitis. This is an infection of the milk ducts. It can be very painful and you will need to notify your doctor to obtain a prescription for antibiotics. You can also treat it with a shower or hot/cold compress.   For concerns about your baby, please call your pediatrician.  For breastfeeding concerns, the lactation consultant can be reached at [phone].   Postpartum blues (feelings of happy one minute and sad another minute) are normal for the first few weeks but if it gets worse let your doctor know.   Congratulations! We enjoyed caring for you and your new bundle of joy!

## 2024-01-06 NOTE — Anesthesia Postprocedure Evaluation (Signed)
Anesthesia Post Note  Patient: DEBAR PLATE  Procedure(s) Performed: CESAREAN SECTION WITH BILATERAL TUBAL LIGATION (Bilateral)  Patient location during evaluation: Mother Baby Anesthesia Type: Epidural Level of consciousness: awake and alert Pain management: pain level controlled Vital Signs Assessment: post-procedure vital signs reviewed and stable Respiratory status: spontaneous breathing, nonlabored ventilation and respiratory function stable Cardiovascular status: stable Postop Assessment: no headache, no backache and epidural receding Anesthetic complications: no   No notable events documented.   Last Vitals:  Vitals:   01/06/24 0406 01/06/24 0749  BP: 132/88 125/84  Pulse: (!) 117 98  Resp:  18  Temp: 37.3 C 37.5 C  SpO2: 100% 96%    Last Pain:  Vitals:   01/06/24 0749  TempSrc: Oral  PainSc:                  Karoline Caldwell

## 2024-01-06 NOTE — Lactation Note (Signed)
This note was copied from a baby's chart. Lactation Consultation Note  Patient Name: Cindy Aguilar [id number] Date: 01/06/2024 Age:30 hours Reason for consult: Follow-up assessment;Term   Maternal Data Lactation to room for a follow up assessment w/ a 22hr old baby Cindy.  Patient stated that feeding has been going well but there were some times at night where infant was a little fussy and she is wondering if she doesn't have enough milk.  Patient stated that she is going to be getting blood today and was told this may her milk supply.    Feeding Mother's Current Feeding Choice: Breast Milk  No feeding observed at this time.   Lactation Tools Discussed/Used Tools: Pump Breast pump type: Double-Electric Breast Pump Pump Education: Setup, frequency, and cleaning;Milk Storage Reason for Pumping: Patient receiving blood Pumping frequency: Recommended pumping every other feeding to protect milk supply.  LC provided patient w/ a DEBP.  Informed patient that sometimes when there is a large loss of blood milk "coming in" may take longer but if she pumps maybe every other feeding session this will protect milk supply and give extra stimulation to the breast.  Patient verbalized understanding.   We did not pump during this visit.    Interventions Interventions: DEBP;Education  Consult Status Consult Status: Follow-up Follow-up type: In-patient    Cindy Aguilar 01/06/2024, 10:33 AM

## 2024-01-06 NOTE — Significant Event (Signed)
Called to bedside to assess bleeding. Cindy Aguilar passed a large clot when getting up to void. Upon returning to bed, she felt there was still more clot she did not pass. Fundus firm. A moderate clot was swept from the vagina. Bleeding stable; Cindy Aguilar is asymptomatic with normal VS. 400 mg cytotec administered SL. PO methergine q4h ordered.  Glenetta Borg, CNM

## 2024-01-06 NOTE — Progress Notes (Signed)
Foley catheter removed at 0040. Pt tolerated well. Explained importance of voiding within 6 hours. Pt verbalized understanding.

## 2024-01-07 LAB — BPAM RBC
Blood Product Expiration Date: 202502162359
Blood Product Expiration Date: 202502162359
ISSUE DATE / TIME: 202501241058
ISSUE DATE / TIME: 202501241352
Unit Type and Rh: 7300
Unit Type and Rh: 7300

## 2024-01-07 LAB — TYPE AND SCREEN
ABO/RH(D): B POS
Antibody Screen: NEGATIVE
Unit division: 0
Unit division: 0

## 2024-01-07 MED ORDER — OXYCODONE HCL 5 MG PO TABS: 5.0000 mg | ORAL_TABLET | ORAL | 0 refills | Status: AC | PRN

## 2024-01-07 NOTE — Discharge Summary (Signed)
Postpartum Discharge Summary  Date of Service updated 01/07/2024     Patient Name: Cindy Aguilar DOB: May 01, 1996 MRN: [id number]  Date of admission: 01/04/2024 Delivery date:01/05/2024 Delivering provider: Hildred Laser Date of discharge: 01/07/2024  Admitting diagnosis: Labor and delivery, indication for care [O75.9] Intrauterine pregnancy: [redacted]w[redacted]d     Secondary diagnosis:  Principal Problem:   Post-term pregnancy, 40-42 weeks of gestation Active Problems:   Supervision of high risk pregnancy, antepartum   Previous cesarean delivery affecting pregnancy, antepartum   Failed trial of labor following previous cesarean, delivered  Additional problems: postpartum Hemorrhage    Discharge diagnosis: Term Pregnancy Delivered and PPH                                              Post partum procedures:blood transfusion Augmentation: N/A Complications: Hemorrhage>1061mL  Hospital course: Sceduled C/S   28 y.o. yo G3P3003 at [redacted]w[redacted]d was admitted to the hospital 01/04/2024 for scheduled cesarean section with the following indication:Elective Repeat and bilateral tubal ligation .Delivery details are as follows:  Membrane Rupture Time/Date: 9:02 PM,01/04/2024  Delivery Method:C-Section, Low Transverse Operative Delivery:N/A Details of operation can be found in separate operative note.  Patient had a postpartum course complicated by hemorrhage and anemia.  She is ambulating, tolerating a regular diet, passing flatus, and urinating well. Patient is discharged home in stable condition on  01/07/24        Newborn Data: Birth date:01/05/2024 Birth time:10:18 AM Gender:Female Living status:Living Apgars:9 ,9  Weight:4450 g    Magnesium Sulfate received: No BMZ received: No Rhophylac:N/A MMR:No T-DaP:Given prenatally Flu: No RSV Vaccine received: Yes Transfusion:Yes Immunizations administered: Immunization History  Administered Date(s) Administered   Influenza,inj,Quad PF,6+ Mos 09/15/2018    Rsv, Bivalent, Protein Subunit Rsvpref,pf Verdis Frederickson) 11/14/2023   Tdap 01/12/2019, 10/03/2023    Physical exam  Vitals:   01/07/24 0343 01/07/24 0836 01/07/24 1305 01/07/24 1629  BP: 111/75 111/80 123/85 126/86  Pulse: 77 71 74 78  Resp: 20 16 18 18   Temp: 98.2 F (36.8 C) 97.8 F (36.6 C) 97.9 F (36.6 C) 98.3 F (36.8 C)  TempSrc: Oral Oral Oral Oral  SpO2: 98% 98% 99% 99%  Weight:      Height:       General: alert, cooperative, and no distress Lochia: appropriate Uterine Fundus: firm Incision: Healing well with no significant drainage, No significant erythema, Dressing is clean, dry, and intact DVT Evaluation: No evidence of DVT seen on physical exam. No cords or calf tenderness. No significant calf/ankle edema. Labs: Lab Results  Component Value Date   WBC 16.9 (H) 01/06/2024   HGB 7.8 (L) 01/06/2024   HCT 24.0 (L) 01/06/2024   MCV 73.5 (L) 01/06/2024   PLT 222 01/06/2024      Latest Ref Rng & Units 05/10/2021    9:24 AM  CMP  Glucose 70 - 99 mg/dL 95   BUN 6 - 20 mg/dL 9   Creatinine 1.32 - 4.40 mg/dL 1.02   Sodium 725 - 366 mmol/L 133   Potassium 3.5 - 5.1 mmol/L 3.6   Chloride 98 - 111 mmol/L 104   CO2 22 - 32 mmol/L 21   Calcium 8.9 - 10.3 mg/dL 8.6   Total Protein 6.5 - 8.1 g/dL 7.2   Total Bilirubin 0.3 - 1.2 mg/dL 0.4   Alkaline Phos 38 - 126 U/L  52   AST 15 - 41 U/L 18   ALT 0 - 44 U/L 17    Edinburgh Score:    01/06/2024    2:25 PM  Edinburgh Postnatal Depression Scale Screening Tool  I have been able to laugh and see the funny side of things. 0  I have looked forward with enjoyment to things. 0  I have blamed myself unnecessarily when things went wrong. 0  I have been anxious or worried for no good reason. 1  I have felt scared or panicky for no good reason. 0  Things have been getting on top of me. 0  I have been so unhappy that I have had difficulty sleeping. 0  I have felt sad or miserable. 0  I have been so unhappy that I have been  crying. 0  The thought of harming myself has occurred to me. 0  Edinburgh Postnatal Depression Scale Total 1      After visit meds:  Allergies as of 01/07/2024   No Known Allergies      Medication List     STOP taking these medications    ondansetron 4 MG disintegrating tablet Commonly known as: ZOFRAN-ODT       TAKE these medications    IRON PO Take 1 tablet by mouth daily.   oxyCODONE 5 MG immediate release tablet Commonly known as: Oxy IR/ROXICODONE Take 1-2 tablets (5-10 mg total) by mouth every 4 (four) hours as needed for moderate pain (pain score 4-6).         Discharge home in stable condition Infant Feeding: Breast Infant Disposition:home with mother Discharge instruction: per After Visit Summary and Postpartum booklet. Activity: Advance as tolerated. Pelvic rest for 6 weeks.  Diet: routine diet Anticipated Birth Control:  BTL done with cesarean section Postpartum Appointment:1 week Additional Postpartum F/U: Postpartum Depression checkup Future Appointments:No future appointments. Follow up Visit:  Follow-up Information     Hildred Laser, MD. Schedule an appointment as soon as possible for a visit in 1 week(s).   Specialties: Obstetrics and Gynecology, Radiology Why: Call and schedule an appointment for an incision check in 1-week post-discharge from the hospital with Dr. Valentino Saxon at [facility]! Contact information: [address] [place] [address] [phone]         Hildred Laser, MD. Schedule an appointment as soon as possible for a visit in 6 week(s).   Specialties: Obstetrics and Gynecology, Radiology Why: Call and schedule an appointment for a 6-week postpartum follow-up with Dr. Valentino Saxon at [facility]! Contact information: [address] [place] [address] [phone]                 Pt continued to be stable through the day. She denies symptoms of anemia and requests to go home tonight. She follow up in  office 1 week for incision check, 2 week video visit for depression screen , 6 weeks for ppv .     01/07/2024 Doreene Burke, CNM

## 2024-01-07 NOTE — Progress Notes (Signed)
Progress Note - Cesarean Delivery  Cindy Aguilar is a 28 y.o. [phone] now PP day 2 s/p C-Section, Low Transverse.   Subjective:  Patient reports no problems with eating, bowel movements, voiding, or their wound  She denies feeling dizzy or light headed with getting out of bed. She is requesting to go home today.   Objective:  Vital signs in last 24 hours: Temp:  [97.4 F (36.3 C)-99 F (37.2 C)] 97.8 F (36.6 C) (01/25 0836) Pulse Rate:  [71-92] 71 (01/25 0836) Resp:  [16-22] 16 (01/25 0836) BP: (109-129)/(69-89) 111/80 (01/25 0836) SpO2:  [96 %-100 %] 98 % (01/25 0836)  Physical Exam:  General: alert, cooperative, appears stated age, and fatigued Lochia: appropriate Uterine Fundus: firm@u -1 Incision: healing well, no significant drainage, no significant erythema, honey comb dressing in place.     Data Review Recent Labs    01/06/24 0500 01/06/24 1908  HGB 5.4* 7.8*  HCT 17.2* 24.0*    Assessment:  Principal Problem:   Labor and delivery, indication for care   Status post Cesarean section. Postoperative course complicated by postpartum hemorrhage, 2 units blood given    Pt would like to go home today, state she is doing well and denies any symptoms. Discussed continued observation until this evening. IF she continues to do well , will discharge home tonight at dinner.   Plan:       Continue current care.Plan for discharge this evening.     Doreene Burke, CNM  01/07/2024 9:32 AM

## 2024-01-07 NOTE — Progress Notes (Signed)
All DC instructions given to pt. No needs or concerns expressed. Dc'd iv in LFA. Pt dc'd home via WC per TIlla, ST with baby and FOB

## 2024-01-07 NOTE — Plan of Care (Signed)

## 2024-01-07 NOTE — Lactation Note (Signed)
This note was copied from a baby's chart. Lactation Consultation Note  Patient Name: Cindy Aguilar [id number] Date: 01/07/2024 Age:30 hours Reason for consult: Follow-up assessment;Term;Maternal discharge   Maternal Data Has patient been taught Hand Expression?: Yes Does the patient have breastfeeding experience prior to this delivery?: Yes How long did the patient breastfeed?: 2 yrs  Feeding Mother's Current Feeding Choice: Breast Milk Mom nursing baby on left breast in cradle hold, audible swallows, plans to be d/c'd to home this pm LATCH Score Latch: Grasps breast easily, tongue down, lips flanged, rhythmical sucking.  Audible Swallowing: Spontaneous and intermittent  Type of Nipple: Everted at rest and after stimulation  Comfort (Breast/Nipple): Soft / non-tender  Hold (Positioning): No assistance needed to correctly position infant at breast.  LATCH Score: 10   Lactation Tools Discussed/Used    Interventions Interventions: Coconut oil;Education LC name updated on white board, coconut oil give with review in use, not needed at present Discharge Discharge Education: Engorgement and breast care Pump: Personal WIC Program: No  Consult Status Consult Status: PRN Date: 01/07/24 Follow-up type: In-patient    Dyann Kief 01/07/2024, 10:40 AM

## 2024-01-07 NOTE — Progress Notes (Signed)
Lidocaine patch removed. Honeycomb dressing applied at this time.

## 2024-01-11 ENCOUNTER — Ambulatory Visit (INDEPENDENT_AMBULATORY_CARE_PROVIDER_SITE_OTHER): Payer: Managed Care, Other (non HMO) | Admitting: Obstetrics and Gynecology

## 2024-01-11 ENCOUNTER — Encounter: Payer: Self-pay | Admitting: Obstetrics and Gynecology

## 2024-01-11 VITALS — BP 132/91 | HR 92 | Resp 16 | Ht 63.0 in | Wt 165.0 lb

## 2024-01-11 DIAGNOSIS — Z4889 Encounter for other specified surgical aftercare: Secondary | ICD-10-CM

## 2024-01-11 DIAGNOSIS — Z1332 Encounter for screening for maternal depression: Secondary | ICD-10-CM | POA: Diagnosis not present

## 2024-01-11 DIAGNOSIS — Z9289 Personal history of other medical treatment: Secondary | ICD-10-CM | POA: Insufficient documentation

## 2024-01-11 NOTE — Patient Instructions (Signed)

## 2024-01-11 NOTE — Progress Notes (Signed)
OBSTETRICS/GYNECOLOGY POST-OPERATIVE CLINIC VISIT  Subjective:     Cindy Aguilar is a 28 y.o.  G6P3003 female who presents to the clinic 1 weeks status post CESAREAN SECTION WITH BILATERAL TUBAL LIGATION  for failed induction of labor with post-dates pregnancy, desiring TOLAC after previous C-section. Delivery was at 41 gestational weeks, macrosomic infant ( 9#13oz). See Delivery Summary for details of hospital course.  Patient did experience delayed postpartum hemorrhage resulting in need for  blood transfusion (2 units PRBCs). Eating a regular diet without difficulty. Bowel movements are normal. The patient is not having any pain.  Taking iron supplements as prescribed.   So far, the remainder of her postpartum course has been well so far. Baby is feeding by breast. Bleeding: light most days. Postpartum depression screening: negative.  EDPS score is 0.    The following portions of the patient's history were reviewed and updated as appropriate: allergies, current medications, past family history, past medical history, past social history, past surgical history, and problem list.  Review of Systems Pertinent items noted in HPI and remainder of comprehensive ROS otherwise negative.   Objective:   BP (!) 132/91   Pulse 92   Resp 16   Ht 5\' 3"  (1.6 m)   Wt 165 lb (74.8 kg)   Breastfeeding Yes   BMI 29.23 kg/m  Body mass index is 29.23 kg/m.  General:  alert and no distress  Abdomen: soft, bowel sounds active, non-tender  Incision:   Honeycomb bandage removed. Inicision healing well, no drainage, no erythema, no hernia, no seroma, no swelling, no dehiscence, incision well approximated  Psych: normal speech, affect. Good mood.       01/11/2024    3:34 PM 01/06/2024    2:25 PM 01/05/2024    8:45 PM 01/05/2024    2:30 PM 03/29/2019    3:27 AM  Edinburgh Postnatal Depression Scale Screening Tool  I have been able to laugh and see the funny side of things. 0 0 -- -- 0  I have looked  forward with enjoyment to things. 0 0   0  I have blamed myself unnecessarily when things went wrong. 0 0   1  I have been anxious or worried for no good reason. 0 1   0  I have felt scared or panicky for no good reason. 0 0   0  Things have been getting on top of me. 0 0   0  I have been so unhappy that I have had difficulty sleeping. 0 0   0  I have felt sad or miserable. 0 0   1  I have been so unhappy that I have been crying. 0 0   0  The thought of harming myself has occurred to me. 0 0   0  Edinburgh Postnatal Depression Scale Total 0 1   2     Pathology:  None   Labs:     Latest Ref Rng & Units 01/06/2024    7:08 PM 01/06/2024    5:00 AM 01/04/2024    5:56 AM  CBC  WBC 4.0 - 10.5 K/uL  16.9  10.7   Hemoglobin 12.0 - 15.0 g/dL 7.8  5.4  16.1   Hematocrit 36.0 - 46.0 % 24.0  17.2  33.7   Platelets 150 - 400 K/uL  222  335      Assessment:   Patient s/p CESAREAN SECTION WITH BILATERAL TUBAL LIGATION.  Doing well postoperatively. History of PPH  requiring blood transfusion Screen for maternal depression  Plan:   1. Continue any current medications as instructed by provider. Continue iron until final postpartum check. Will recheck Hgb at that time.  2. Wound care discussed. 3. Maternal depression screen negative. Rescreen at final postpartum visit in 5 weeks.  4. Activity restrictions: no bending, stooping, or squatting, no lifting more than 15 pounds, and pelvic rest 5. Anticipated return to work:  April 2025 . 6. Follow up: 5 week for final postpartum visit.      Hildred Laser, MD [facility]

## 2024-01-19 ENCOUNTER — Encounter: Payer: Self-pay | Admitting: Obstetrics and Gynecology

## 2024-01-19 NOTE — Telephone Encounter (Signed)
 Can she just trim this? She has an appointment scheduled for 2/12 with you. Please advise

## 2024-01-19 NOTE — Telephone Encounter (Signed)
 Yes she can just trim it.

## 2024-01-20 NOTE — Telephone Encounter (Signed)
Pt aware.

## 2024-01-25 ENCOUNTER — Ambulatory Visit: Payer: Managed Care, Other (non HMO) | Admitting: Obstetrics and Gynecology

## 2024-02-15 ENCOUNTER — Encounter: Payer: Self-pay | Admitting: Obstetrics and Gynecology

## 2024-02-15 ENCOUNTER — Ambulatory Visit: Payer: Managed Care, Other (non HMO) | Admitting: Obstetrics and Gynecology

## 2024-02-15 DIAGNOSIS — Z9289 Personal history of other medical treatment: Secondary | ICD-10-CM

## 2024-02-15 DIAGNOSIS — Z1332 Encounter for screening for maternal depression: Secondary | ICD-10-CM

## 2024-02-15 LAB — POCT HEMOGLOBIN: Hemoglobin: 13.1 g/dL (ref 11–14.6)

## 2024-02-15 NOTE — Progress Notes (Signed)
 OBSTETRICS POSTPARTUM CLINIC PROGRESS NOTE  Subjective:     Cindy Aguilar is a 28 y.o. G82P3003 female who presents for a postpartum visit. She is 6 weeks postpartum following a low cervical transverse Cesarean section with Bilateral Tubal Ligation. I have fully reviewed the prenatal and intrapartum course. The delivery was at 41.1 gestational weeks.  Anesthesia: epidural. Postpartum course has been going well. Baby's course has been good. Baby is feeding by breast. Bleeding: patient has not not resumed menses, with No LMP recorded.. Bowel function is normal. Bladder function is normal. Patient is not sexually active.  Postpartum depression screening: negative.  EDPS score is 0.    The following portions of the patient's history were reviewed and updated as appropriate: allergies, current medications, past family history, past medical history, past social history, past surgical history, and problem list.  Review of Systems Pertinent items are noted in HPI.   Objective:    There were no vitals taken for this visit.  General:  alert and no distress   Breasts:  inspection negative, no nipple discharge or bleeding,   Lungs: clear to auscultation bilaterally  Heart:  regular rate and rhythm, S1, S2 normal, no murmur, click, rub or gallop  Abdomen: soft, non-tender; bowel sounds normal; no masses,  no organomegaly.  Well healed Pfannenstiel incision   Vulva:  normal  Vagina: normal vagina, no discharge, exudate, lesion, or erythema  Cervix:  no cervical motion tenderness and no lesions  Corpus: normal size, contour, position, consistency, mobility, non-tender  Adnexa:  normal adnexa and no mass, fullness, tenderness  Rectal Exam: Not performed.            02/15/2024   10:05 AM 01/11/2024    3:34 PM 01/06/2024    2:25 PM 01/05/2024    8:45 PM 01/05/2024    2:30 PM  Edinburgh Postnatal Depression Scale Screening Tool  I have been able to laugh and see the funny side of things. 0 0 0 -- --   I have looked forward with enjoyment to things. 0 0 0    I have blamed myself unnecessarily when things went wrong. 0 0 0    I have been anxious or worried for no good reason. 0 0 1    I have felt scared or panicky for no good reason. 0 0 0    Things have been getting on top of me. 0 0 0    I have been so unhappy that I have had difficulty sleeping. 0 0 0    I have felt sad or miserable. 0 0 0    I have been so unhappy that I have been crying. 0 0 0    The thought of harming myself has occurred to me. 0 0 0    Edinburgh Postnatal Depression Scale Total 0 0 1       Labs:  Lab Results  Component Value Date   HGB 7.8 (L) 01/06/2024    Assessment:   1. Postpartum care following cesarean delivery   2. History of blood transfusion   3. Delayed postpartum hemorrhage   4. Encounter for screening for maternal depression      Plan:   1. Contraception: tubal ligation 2. Will check Hgb for h/o postpartum anemia of less than 10.  3. Work notice provided for patient to return to work in April (took 12 weeks of leave).  4. Maternal depression screen negative today.  5.  Follow up in: 4 months annual  exam.    Hildred Laser, MD [facility]
